# Patient Record
Sex: Female | Born: 1975
Health system: Southern US, Community
[De-identification: ages and names within clinical notes are randomized; demographics above are authoritative.]

## PROBLEM LIST (undated history)

## (undated) DIAGNOSIS — N939 Abnormal uterine and vaginal bleeding, unspecified: Secondary | ICD-10-CM

## (undated) DIAGNOSIS — D500 Iron deficiency anemia secondary to blood loss (chronic): Secondary | ICD-10-CM

## (undated) DIAGNOSIS — N83202 Unspecified ovarian cyst, left side: Secondary | ICD-10-CM

## (undated) DIAGNOSIS — Z973 Presence of spectacles and contact lenses: Secondary | ICD-10-CM

## (undated) DIAGNOSIS — D259 Leiomyoma of uterus, unspecified: Secondary | ICD-10-CM

## (undated) DIAGNOSIS — Z87442 Personal history of urinary calculi: Secondary | ICD-10-CM

## (undated) DIAGNOSIS — R011 Cardiac murmur, unspecified: Secondary | ICD-10-CM

---
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
## 2004-08-31 ENCOUNTER — Other Ambulatory Visit: Admission: RE | Admit: 2004-08-31 | Discharge: 2004-08-31 | Payer: Self-pay | Admitting: Family Medicine

## 2005-10-25 ENCOUNTER — Other Ambulatory Visit: Admission: RE | Admit: 2005-10-25 | Discharge: 2005-10-25 | Payer: Self-pay | Admitting: Family Medicine

## 2006-10-29 ENCOUNTER — Other Ambulatory Visit: Admission: RE | Admit: 2006-10-29 | Discharge: 2006-10-29 | Payer: Self-pay | Admitting: Family Medicine

## 2007-05-06 ENCOUNTER — Other Ambulatory Visit: Admission: RE | Admit: 2007-05-06 | Discharge: 2007-05-06 | Payer: Self-pay | Admitting: Family Medicine

## 2007-05-23 ENCOUNTER — Ambulatory Visit: Payer: Self-pay | Admitting: Oncology

## 2007-07-25 ENCOUNTER — Ambulatory Visit: Payer: Self-pay | Admitting: Oncology

## 2008-04-28 ENCOUNTER — Other Ambulatory Visit: Admission: RE | Admit: 2008-04-28 | Discharge: 2008-04-28 | Payer: Self-pay | Admitting: Family Medicine

## 2009-07-14 ENCOUNTER — Other Ambulatory Visit: Admission: RE | Admit: 2009-07-14 | Discharge: 2009-07-14 | Payer: Self-pay | Admitting: Family Medicine

## 2009-07-25 ENCOUNTER — Encounter: Admission: RE | Admit: 2009-07-25 | Discharge: 2009-07-25 | Payer: Self-pay | Admitting: Family Medicine

## 2010-06-06 IMAGING — MG MM SCREEN MAMMOGRAM BILATERAL
4 series · 4 of 4 positions shown · non-contrast
Comparison: none

DG SCREEN MAMMOGRAM BILATERAL
Bilateral CC and MLO view(s) were taken.

DIGITAL SCREENING MAMMOGRAM WITH CAD:
The breast tissue is heterogeneously dense.  No masses or malignant type calcifications are 
identified.
Images were processed with CAD.

[R CC]
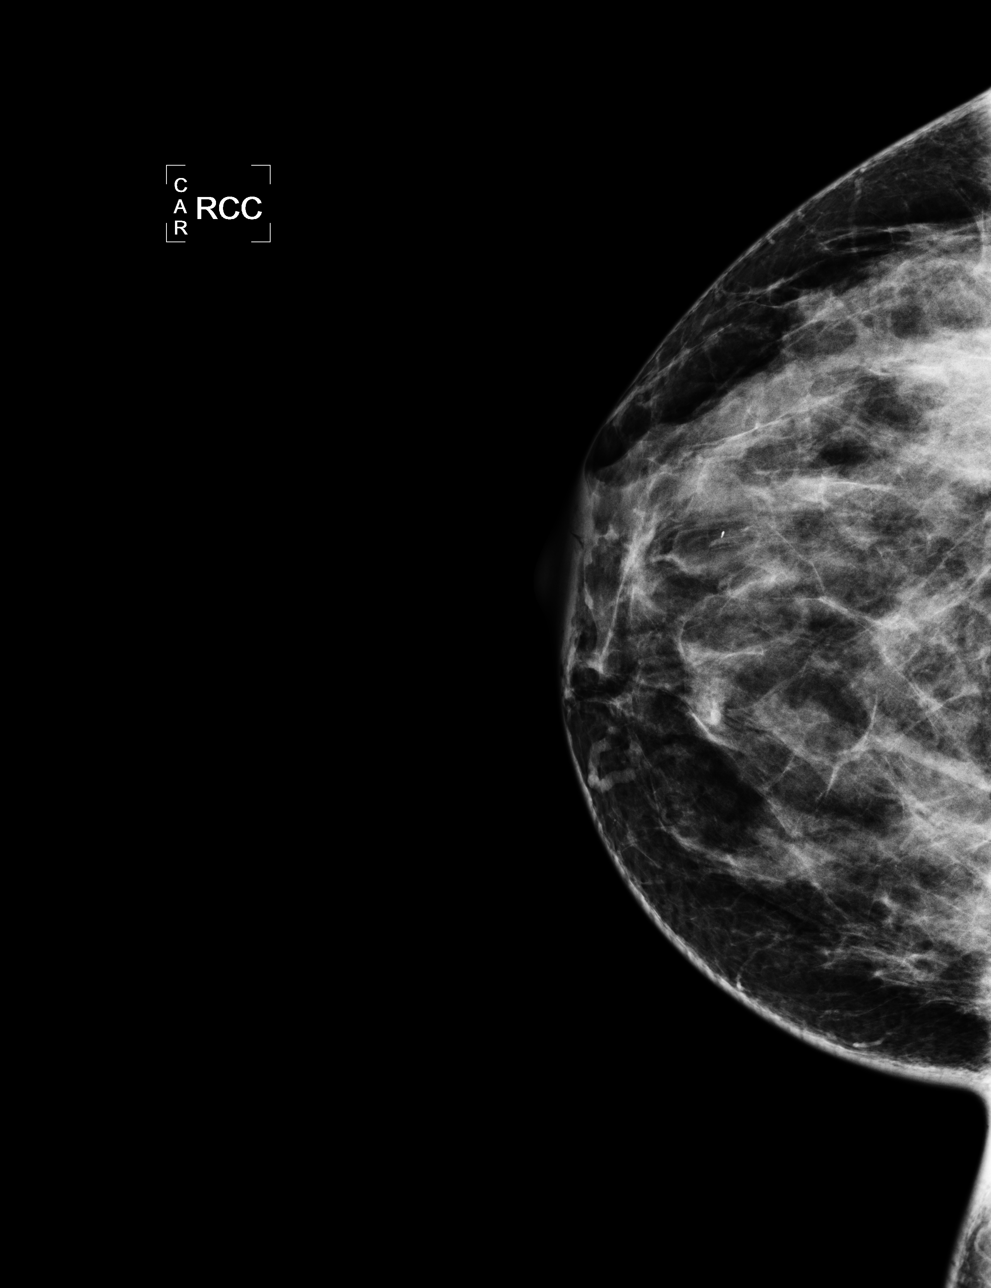

[L CC]
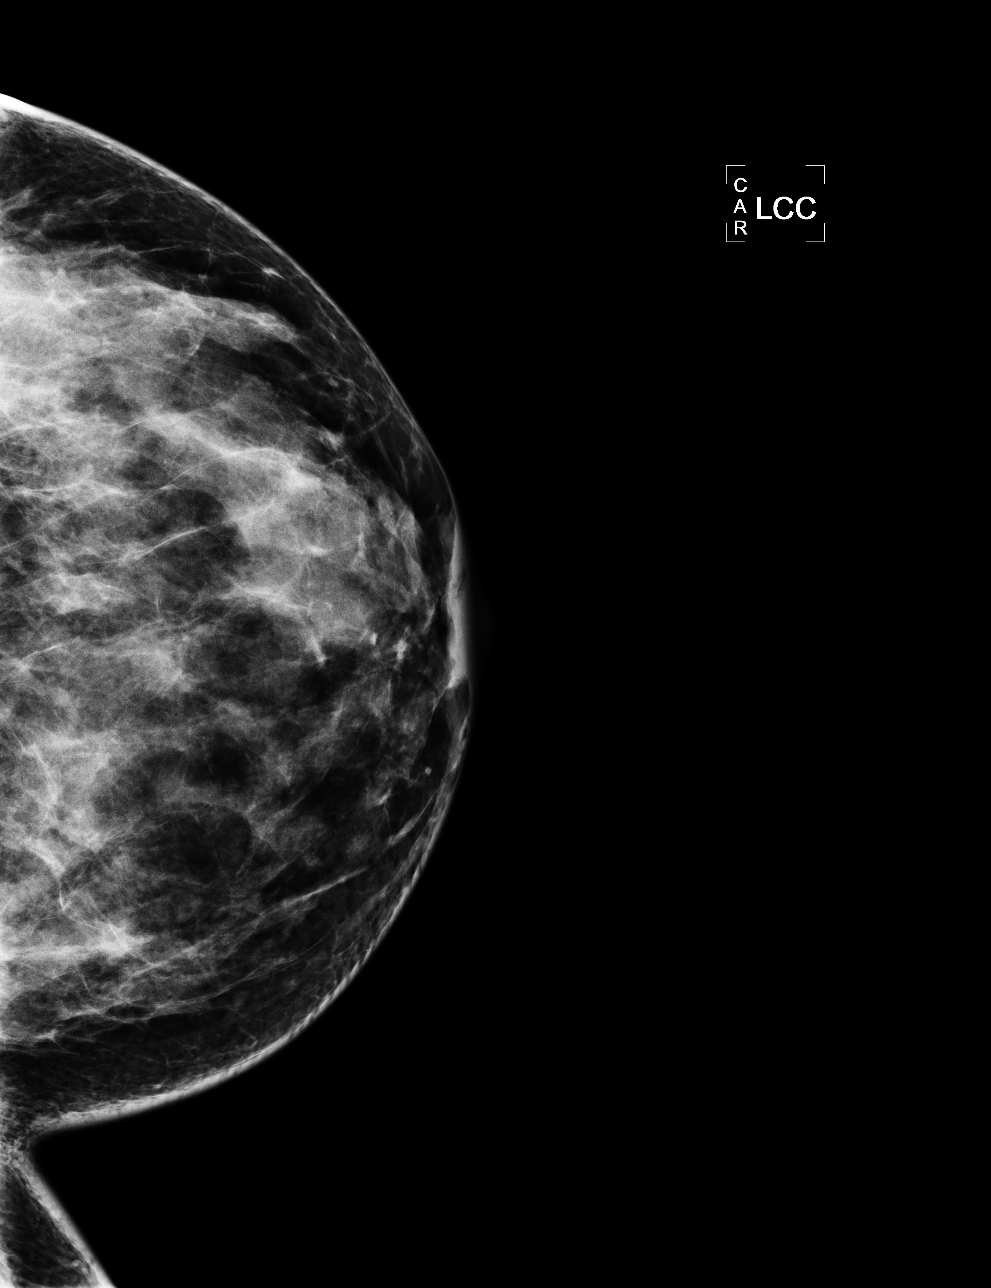

[L MLO]
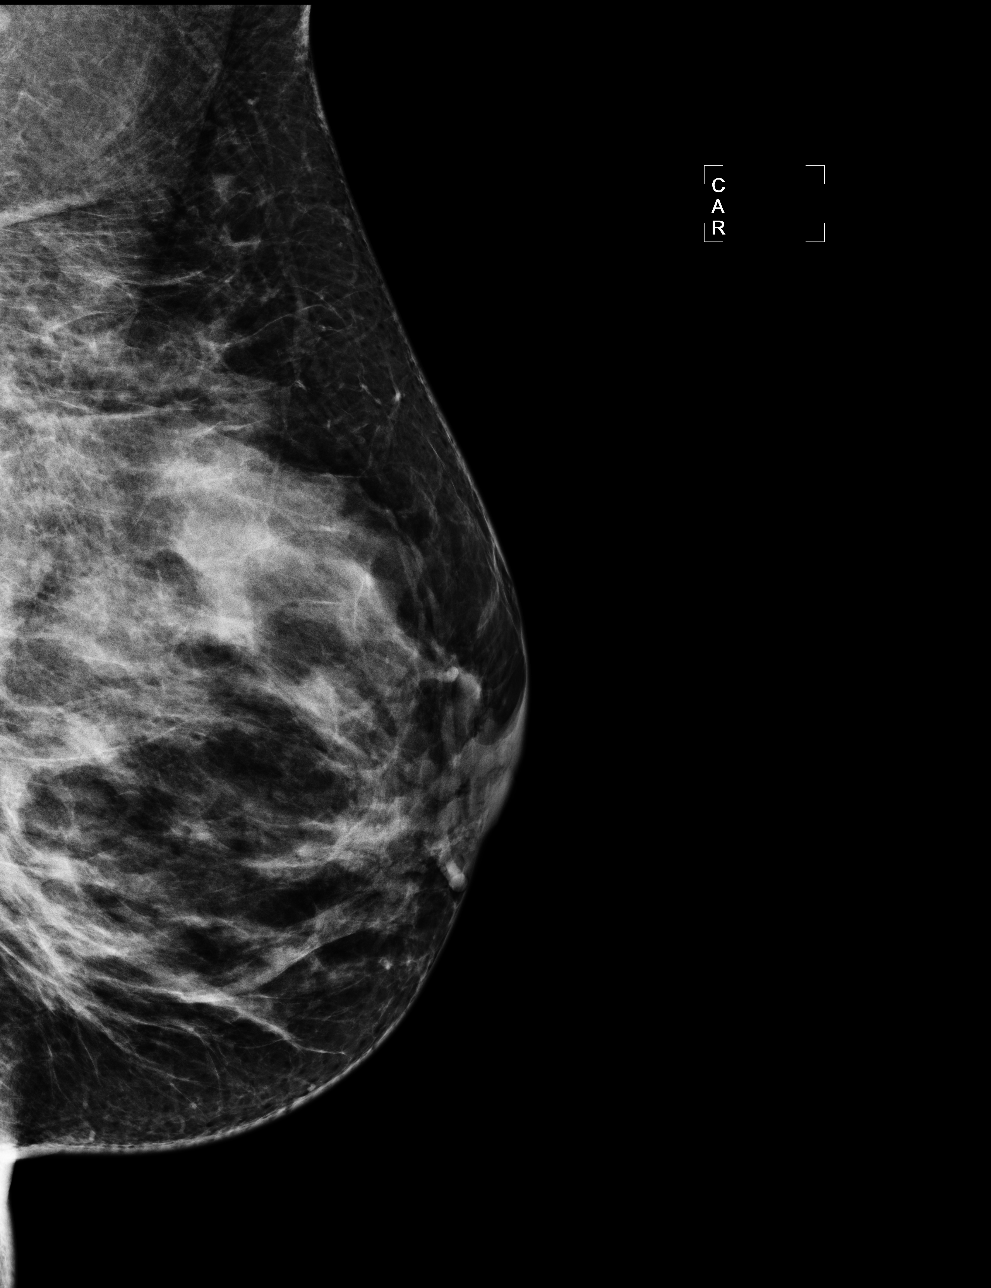

[R MLO]
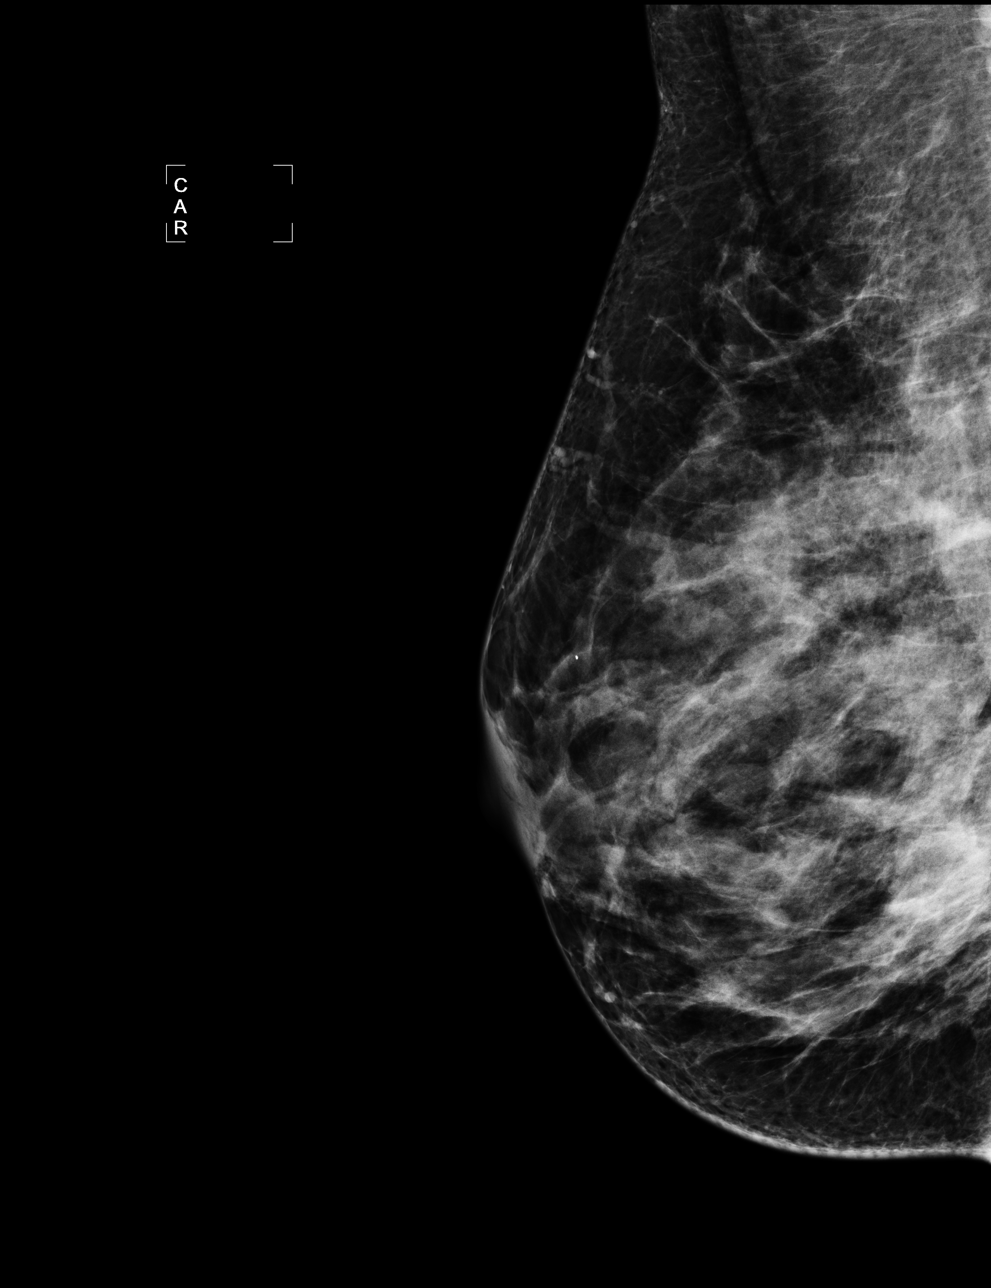

[4 of 4 positions shown; findings below may reference images not displayed]

IMPRESSION: No specific mammographic evidence of malignancy.  Next screening mammogram is recommended in one 
year.

A result letter of this screening mammogram will be mailed directly to the patient.

ASSESSMENT: Negative - BI-RADS 1

Screening mammogram in 1 year.
,

## 2010-07-14 ENCOUNTER — Other Ambulatory Visit: Admission: RE | Admit: 2010-07-14 | Discharge: 2010-07-14 | Payer: Self-pay | Admitting: Family Medicine

## 2010-08-01 ENCOUNTER — Encounter: Admission: RE | Admit: 2010-08-01 | Discharge: 2010-08-01 | Payer: Self-pay | Admitting: Family Medicine

## 2010-08-01 IMAGING — MG MM DIGITAL SCREENING
4 series · 4 of 4 positions shown · non-contrast
Comparison: Prior studies.

DG SCREEN MAMMOGRAM BILATERAL
Bilateral CC and MLO view(s) were taken.

DIGITAL SCREENING MAMMOGRAM WITH CAD:

[R CC]
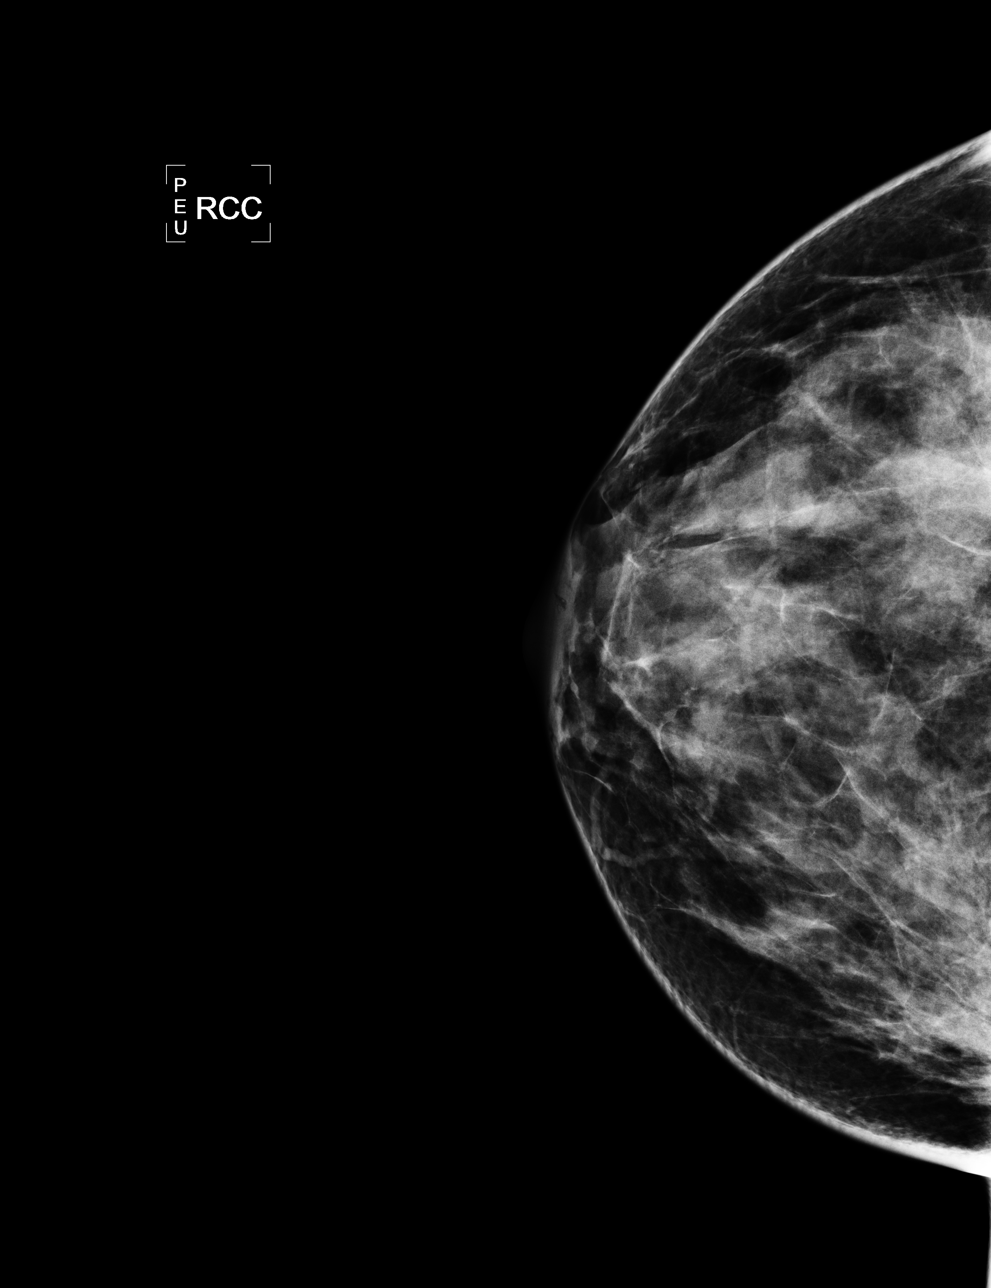

[L CC]
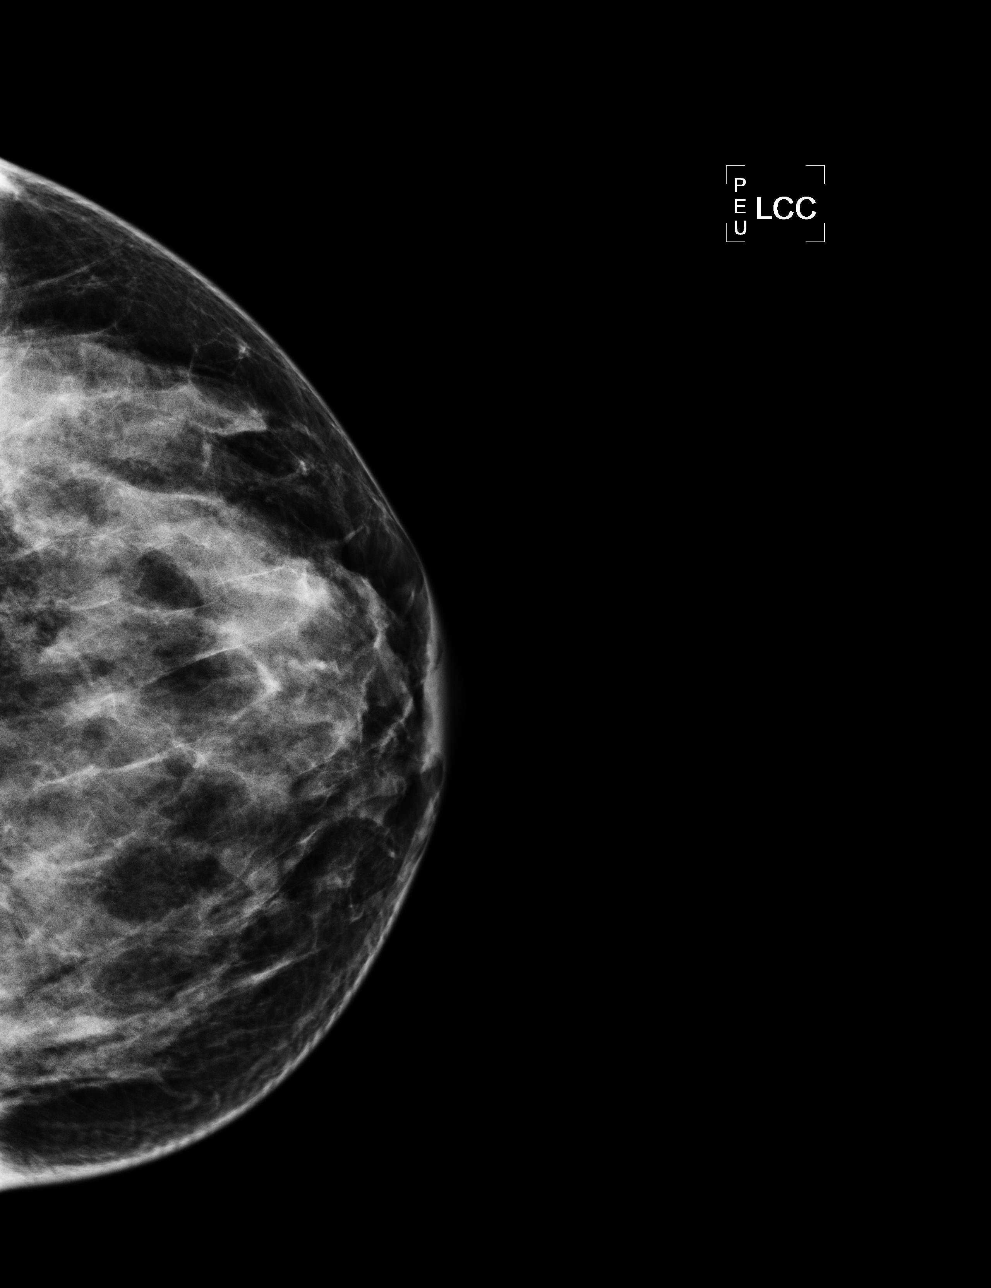

[L MLO]
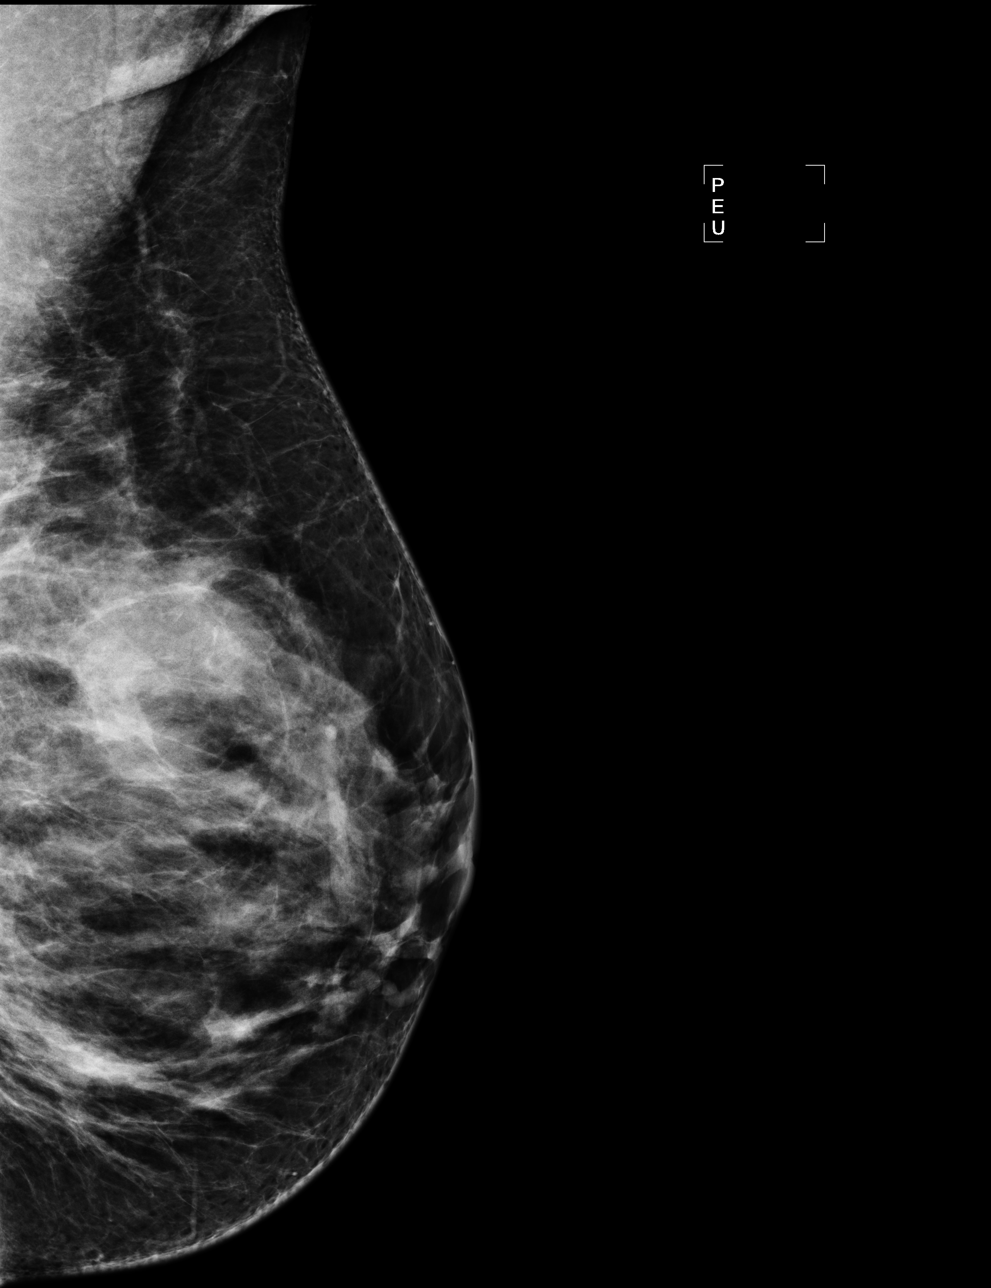

[R MLO]
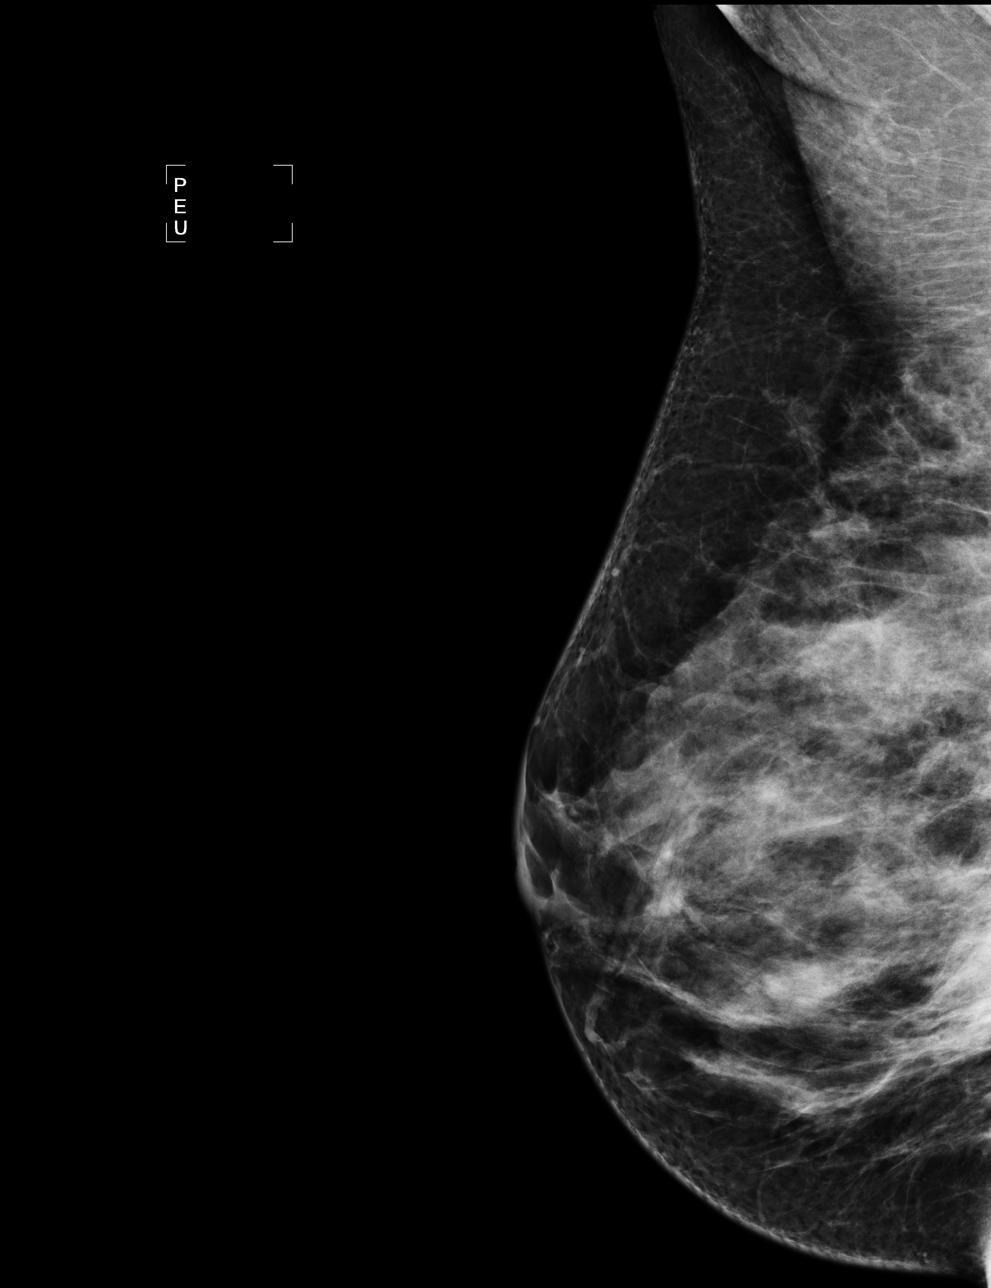

[4 of 4 positions shown; findings below may reference images not displayed]

The breast tissue is heterogeneously dense.  There is no dominant mass, architectural distortion or
calcification to suggest malignancy.

Images were processed with CAD.
IMPRESSION: No mammographic evidence of malignancy.  Suggest yearly screening mammography begin at age 40.

A result letter of this screening mammogram will be mailed directly to the patient.

ASSESSMENT: Negative - BI-RADS 1

Screening mammogram at age 38.
,

## 2012-11-04 ENCOUNTER — Other Ambulatory Visit: Payer: Self-pay | Admitting: Family Medicine

## 2012-11-04 ENCOUNTER — Other Ambulatory Visit (HOSPITAL_COMMUNITY)
Admission: RE | Admit: 2012-11-04 | Discharge: 2012-11-04 | Disposition: A | Discharge status: Home / Self Care | Payer: 59 | Source: Ambulatory Visit | Attending: Family Medicine | Admitting: Family Medicine

## 2012-11-04 DIAGNOSIS — Z01419 Encounter for gynecological examination (general) (routine) without abnormal findings: Secondary | ICD-10-CM | POA: Insufficient documentation

## 2014-11-26 ENCOUNTER — Other Ambulatory Visit: Payer: Self-pay

## 2014-11-26 DIAGNOSIS — Z1231 Encounter for screening mammogram for malignant neoplasm of breast: Secondary | ICD-10-CM

## 2014-12-14 ENCOUNTER — Ambulatory Visit
Admission: RE | Admit: 2014-12-14 | Discharge: 2014-12-14 | Disposition: A | Discharge status: Home / Self Care | Payer: 59 | Source: Ambulatory Visit

## 2014-12-14 DIAGNOSIS — Z1231 Encounter for screening mammogram for malignant neoplasm of breast: Secondary | ICD-10-CM

## 2014-12-14 IMAGING — MG MM SCREEN MAMMOGRAM BILATERAL
4 series · 4 of 4 positions shown · non-contrast
Comparison: Previous exam(s).

CLINICAL DATA: Screening.

EXAM:
DIGITAL SCREENING BILATERAL MAMMOGRAM WITH CAD

[R CC]
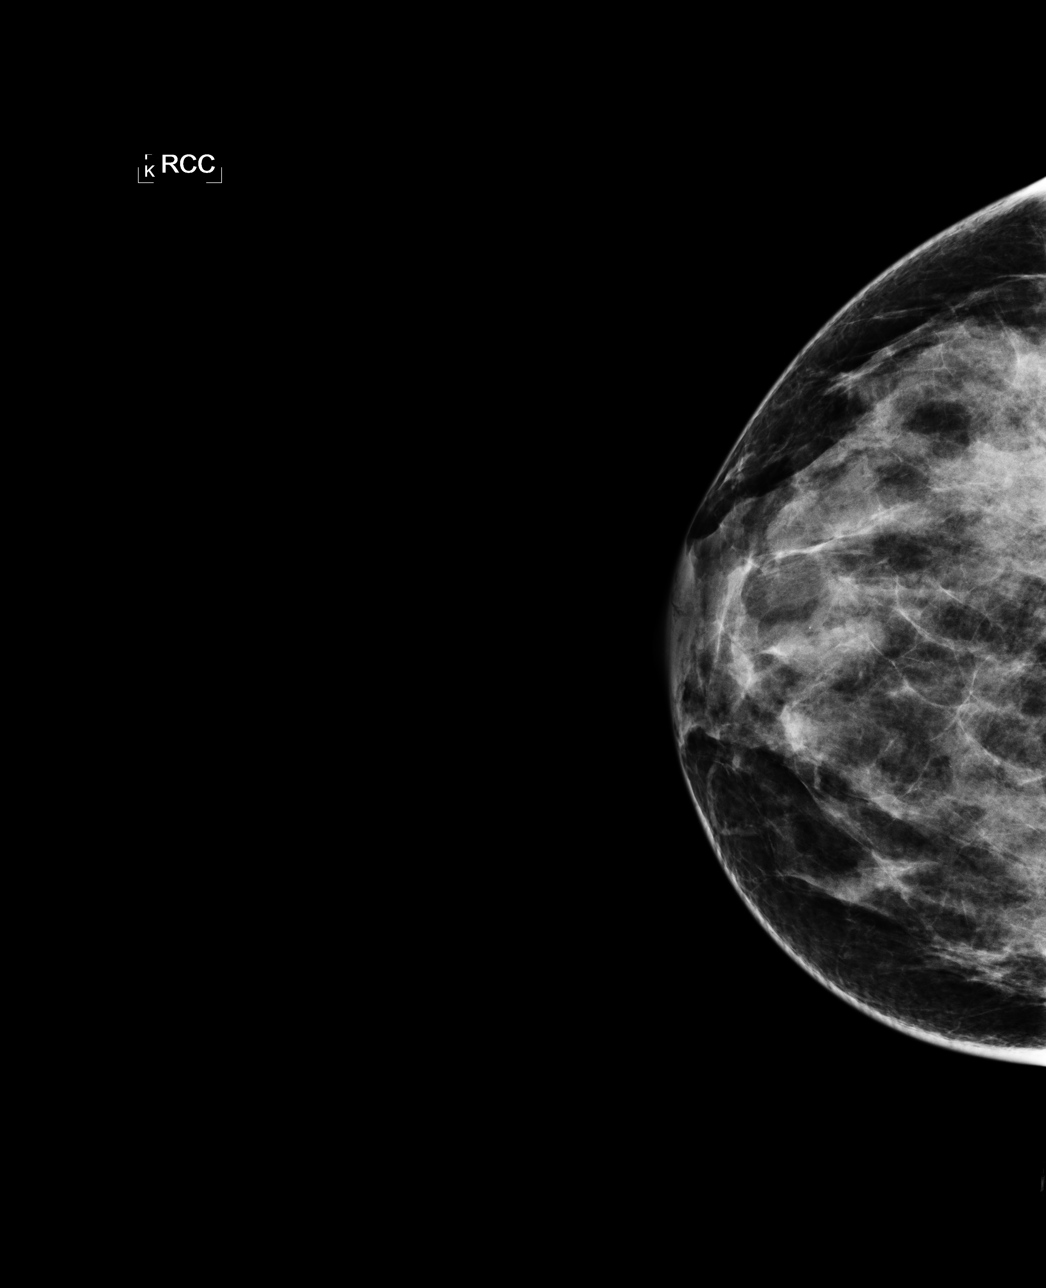

[L CC]
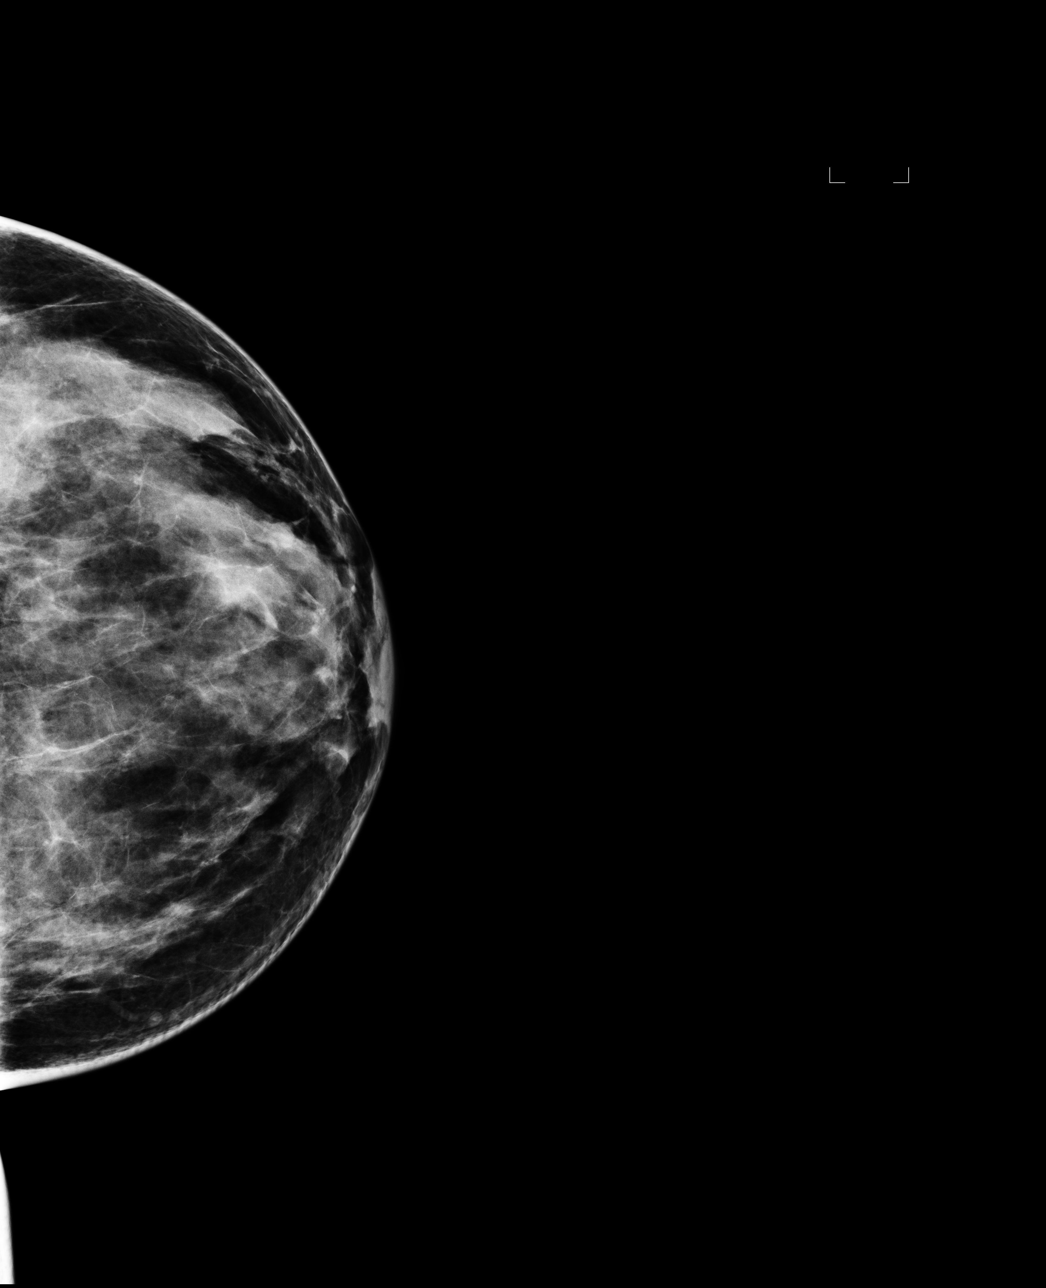

[L MLO]
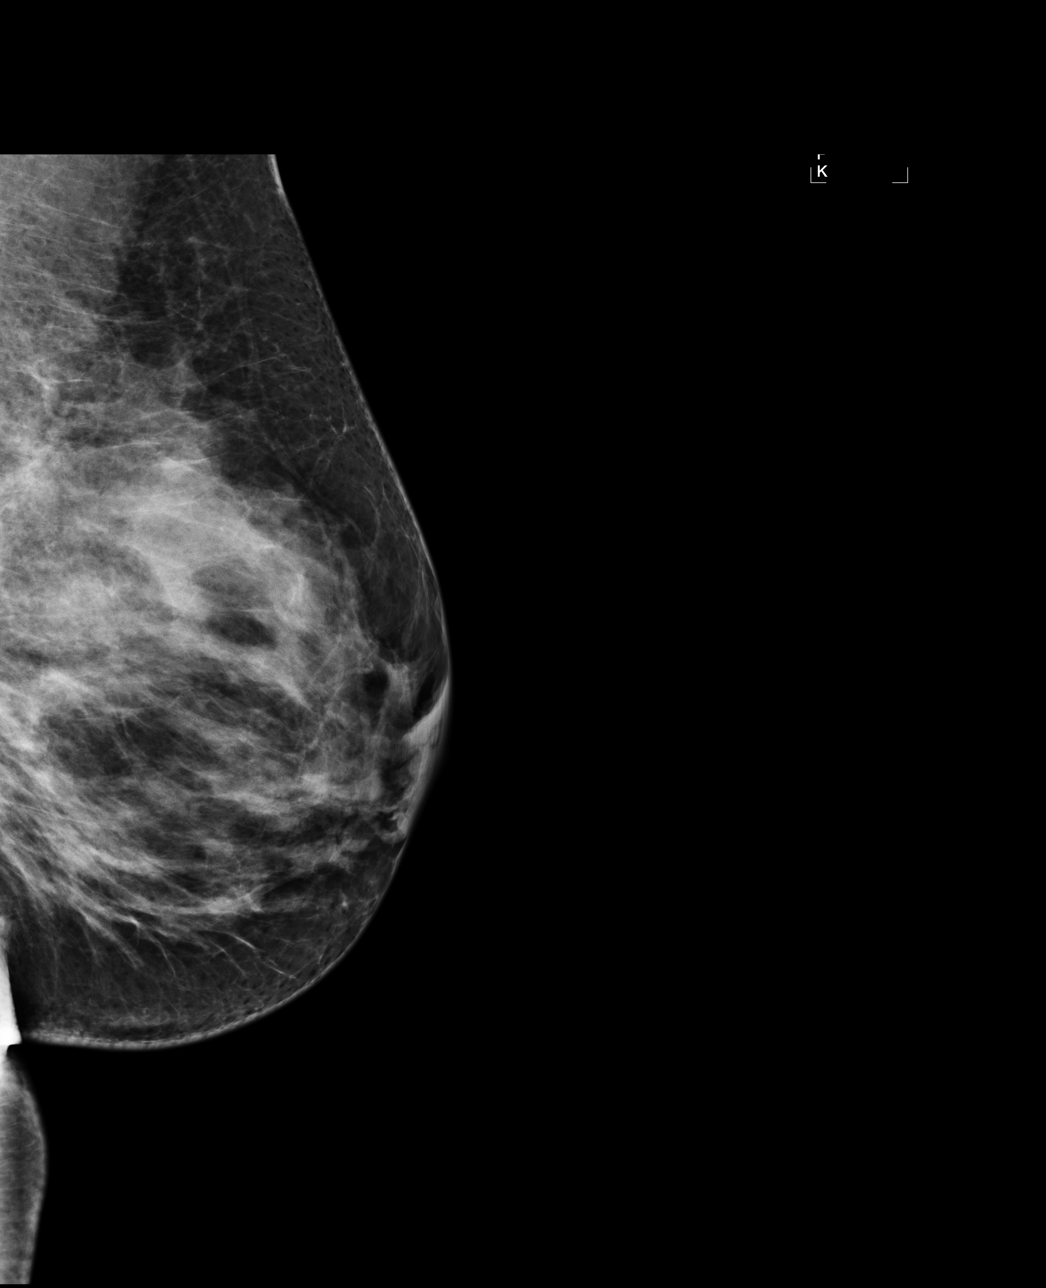

[R MLO]
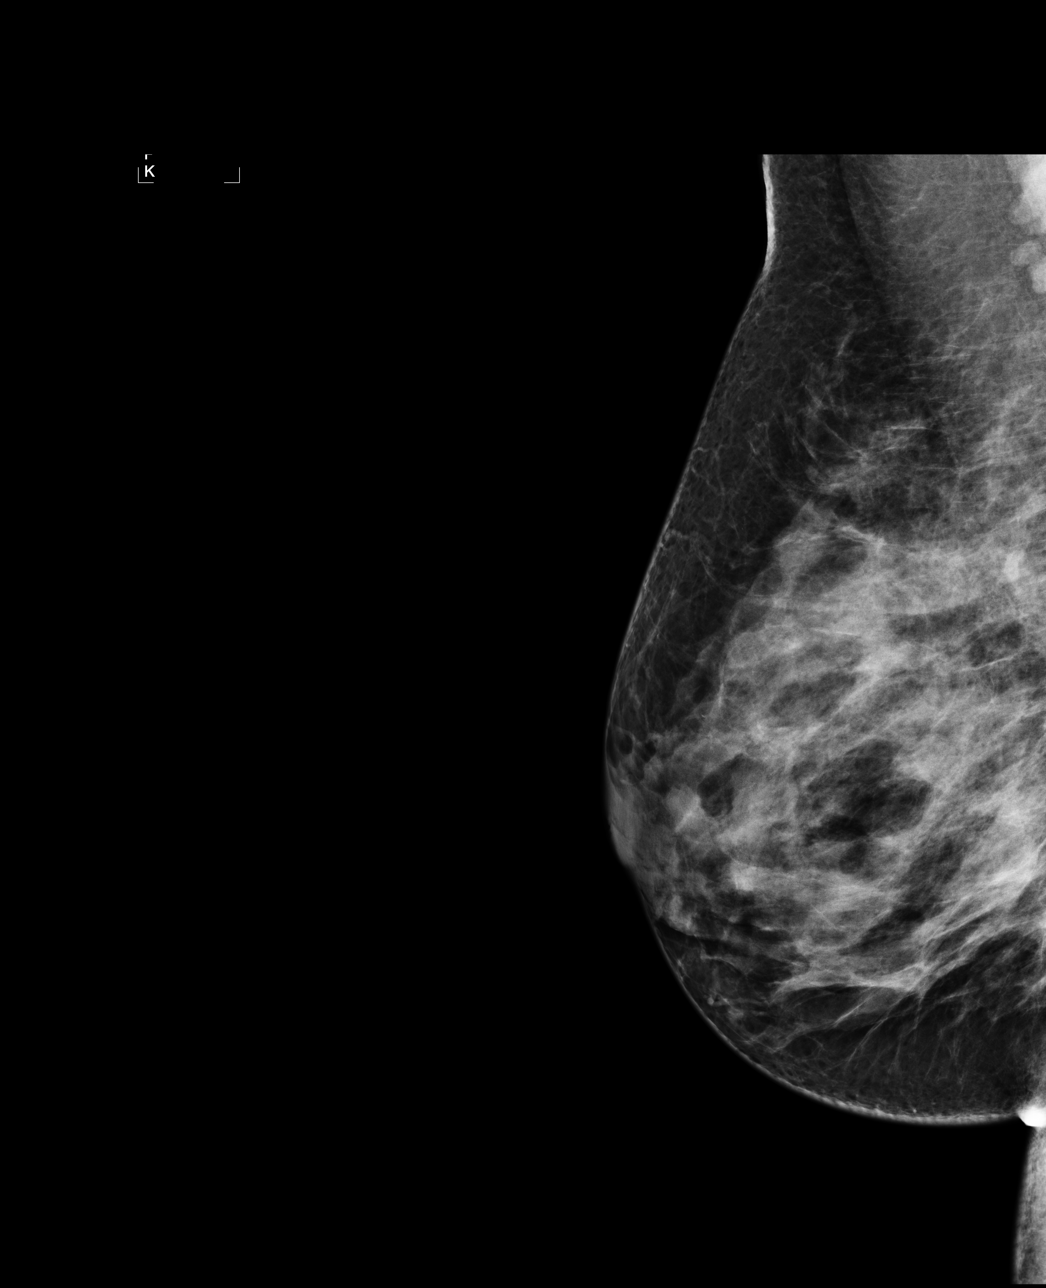

[4 of 4 positions shown; findings below may reference images not displayed]

ACR Breast Density Category c: The breast tissue is heterogeneously
dense, which may obscure small masses.
FINDINGS: There are no findings suspicious for malignancy. Images were
processed with CAD.
IMPRESSION: No mammographic evidence of malignancy. A result letter of this
screening mammogram will be mailed directly to the patient.

RECOMMENDATION:
Screening mammogram in one year. (Code:YJ-2-FEZ)

BI-RADS CATEGORY  1: Negative.

## 2015-11-15 ENCOUNTER — Other Ambulatory Visit: Payer: Self-pay

## 2015-11-15 DIAGNOSIS — Z1231 Encounter for screening mammogram for malignant neoplasm of breast: Secondary | ICD-10-CM

## 2015-12-14 ENCOUNTER — Other Ambulatory Visit: Payer: Self-pay | Admitting: Family Medicine

## 2015-12-14 ENCOUNTER — Other Ambulatory Visit (HOSPITAL_COMMUNITY)
Admission: RE | Admit: 2015-12-14 | Discharge: 2015-12-14 | Disposition: A | Discharge status: Home / Self Care | Payer: 59 | Source: Ambulatory Visit | Attending: Family Medicine | Admitting: Family Medicine

## 2015-12-14 DIAGNOSIS — Z124 Encounter for screening for malignant neoplasm of cervix: Secondary | ICD-10-CM | POA: Diagnosis present

## 2015-12-16 ENCOUNTER — Ambulatory Visit
Admission: RE | Admit: 2015-12-16 | Discharge: 2015-12-16 | Disposition: A | Discharge status: Home / Self Care | Payer: 59 | Source: Ambulatory Visit

## 2015-12-16 DIAGNOSIS — Z1231 Encounter for screening mammogram for malignant neoplasm of breast: Secondary | ICD-10-CM

## 2015-12-16 IMAGING — MG MM SCREENING BREAST TOMO BILATERAL
8 series · 9 of 24 positions shown · non-contrast
Comparison: Previous exam(s).

CLINICAL DATA: Screening.

EXAM:
DIGITAL SCREENING BILATERAL MAMMOGRAM WITH 3D TOMO WITH CAD

[R MLO]
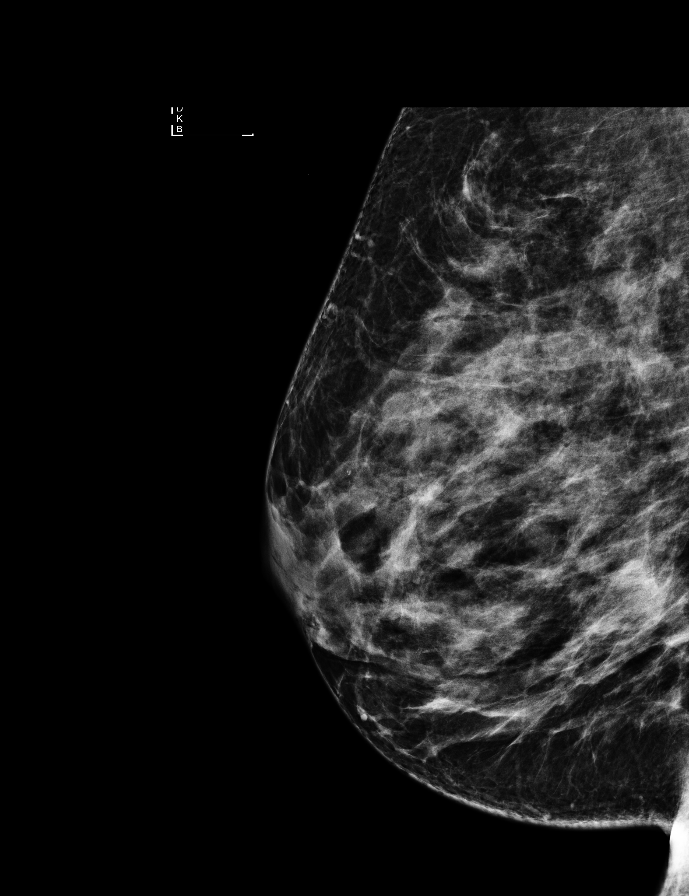

[R CC]
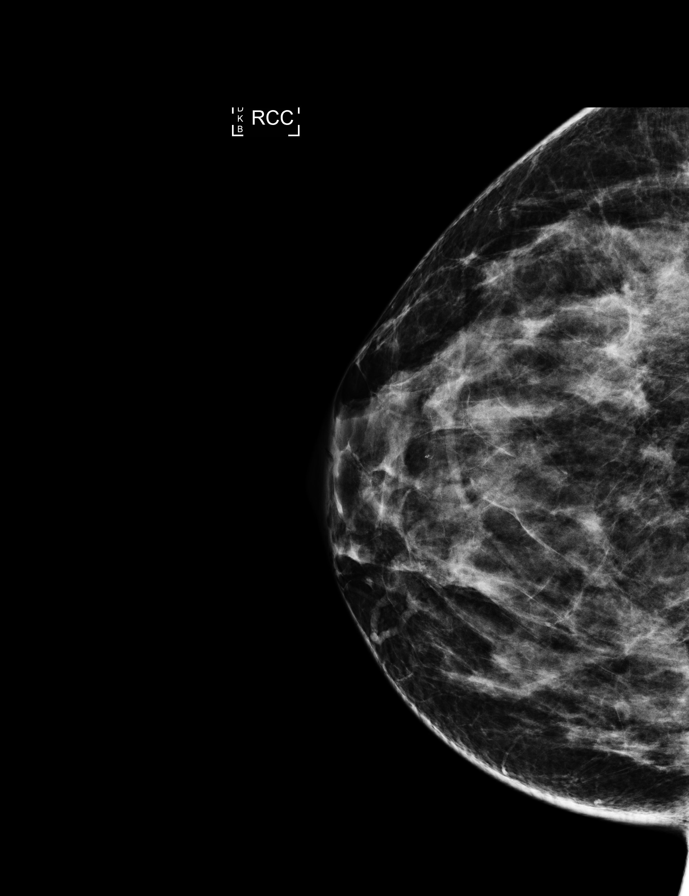

[L CC]
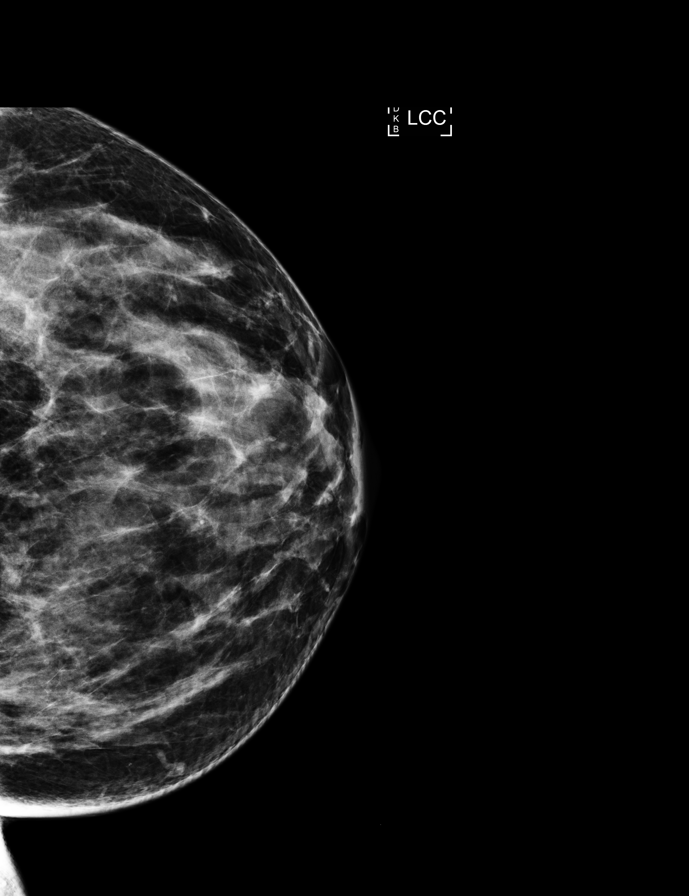

[L MLO]
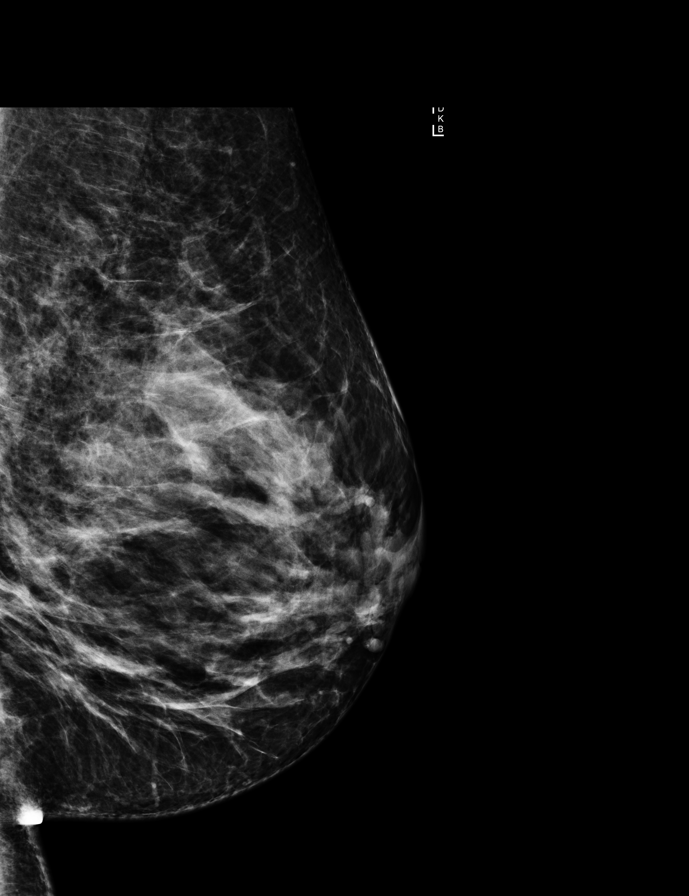

[R CC tomo · 2 of 75 frames shown]
[frame 25/75]
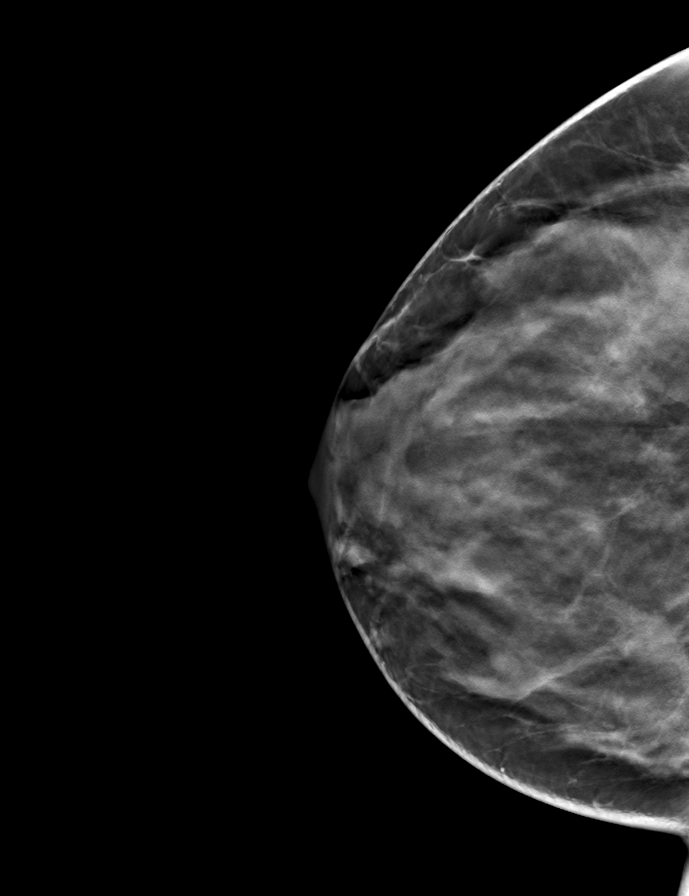
[frame 38/75]
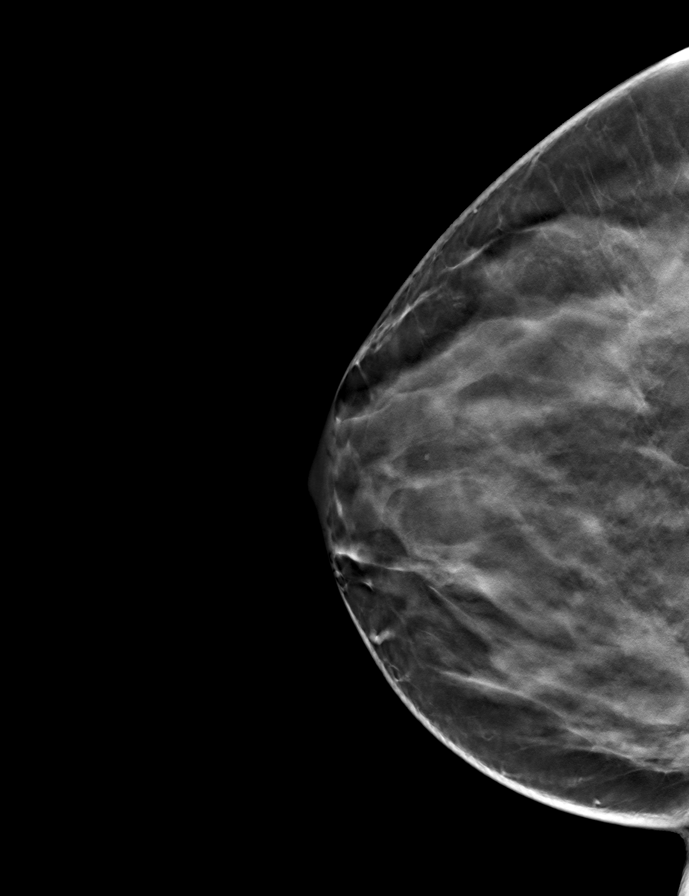

[L CC tomo · tomo slice 38/75.0]
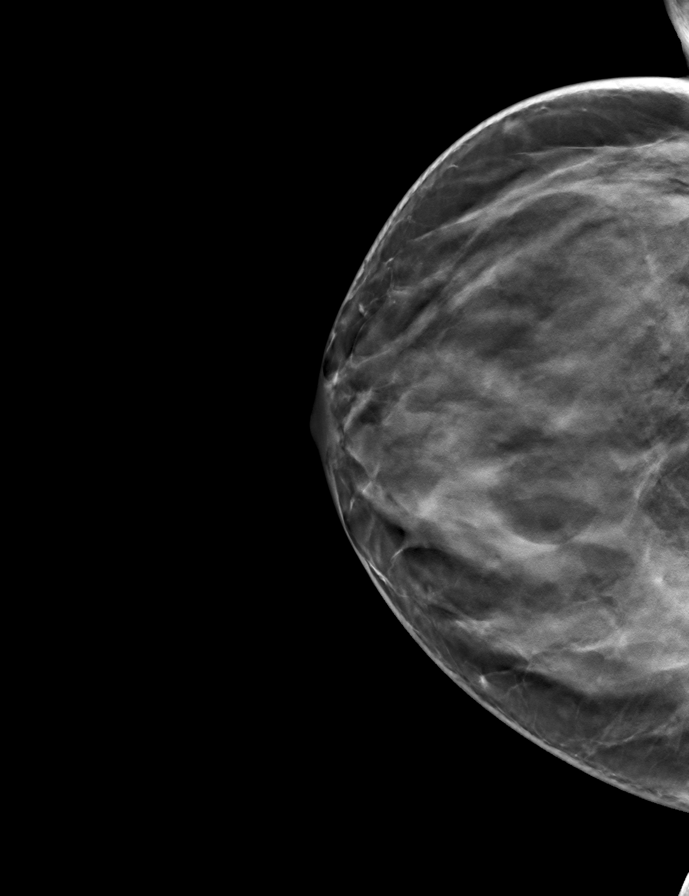

[L MLO tomo · tomo slice 43/84.0]
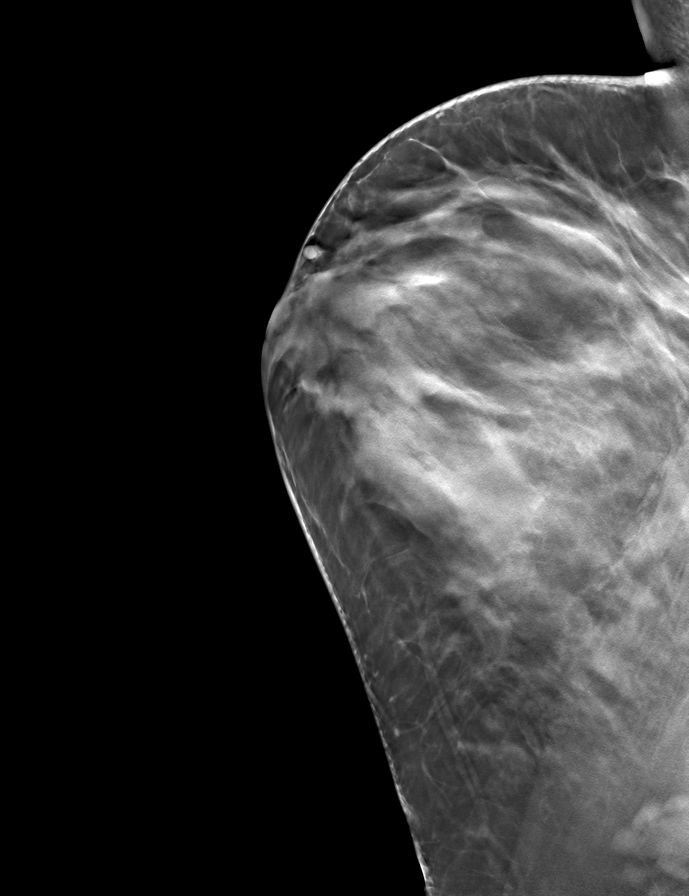

[R MLO tomo · tomo slice 38/75.0]
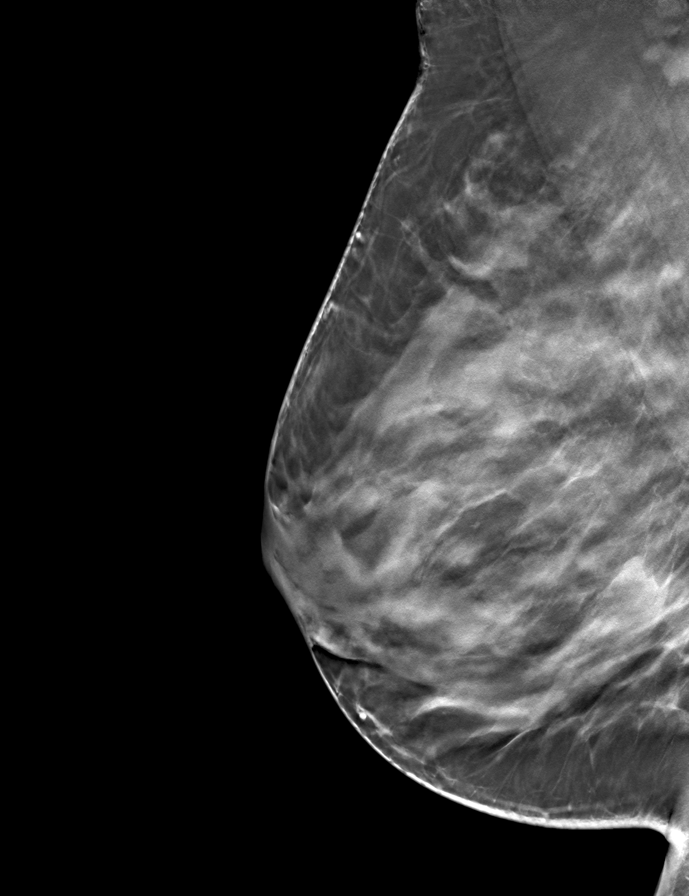

[9 of 24 positions shown; findings below may reference images not displayed]

ACR Breast Density Category c: The breast tissue is heterogeneously
dense, which may obscure small masses.
FINDINGS: There are no findings suspicious for malignancy. Images were
processed with CAD.
IMPRESSION: No mammographic evidence of malignancy. A result letter of this
screening mammogram will be mailed directly to the patient.

RECOMMENDATION:
Screening mammogram in one year. (Code:OA-G-1SS)

BI-RADS CATEGORY  1: Negative.

## 2015-12-19 LAB — CYTOLOGY - PAP

## 2016-12-18 ENCOUNTER — Ambulatory Visit
Admission: RE | Admit: 2016-12-18 | Discharge: 2016-12-18 | Disposition: A | Discharge status: Home / Self Care | Payer: 59 | Source: Ambulatory Visit | Attending: Family Medicine | Admitting: Family Medicine

## 2016-12-18 ENCOUNTER — Other Ambulatory Visit: Payer: Self-pay | Admitting: Family Medicine

## 2016-12-18 DIAGNOSIS — Z1231 Encounter for screening mammogram for malignant neoplasm of breast: Secondary | ICD-10-CM

## 2016-12-18 IMAGING — MG 2D DIGITAL SCREENING BILATERAL MAMMOGRAM WITH CAD AND ADJUNCT TO
8 of 12 series · 8 of 28 positions shown · non-contrast
Comparison: Previous exam(s).

CLINICAL DATA: Screening.

EXAM:
2D DIGITAL SCREENING BILATERAL MAMMOGRAM WITH CAD AND ADJUNCT TOMO

[L MLO synth-2D]
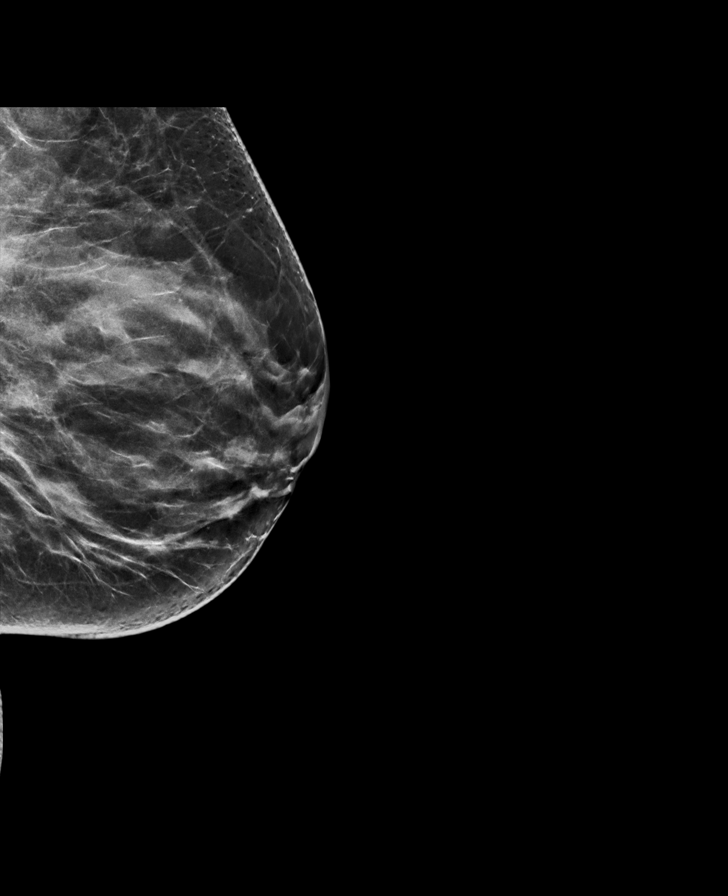

[R CC]
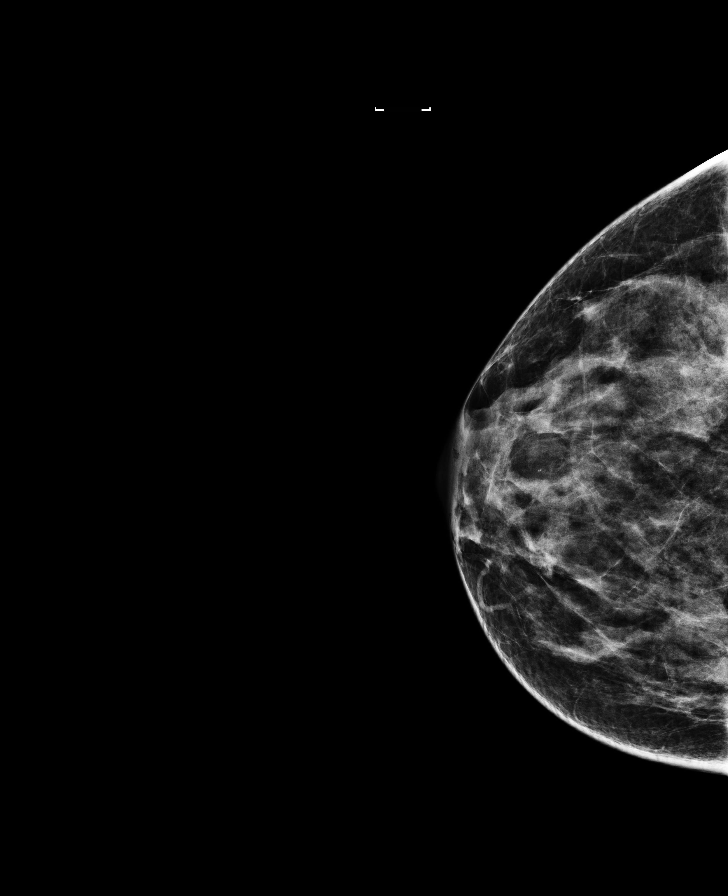

[R CC synth-2D]
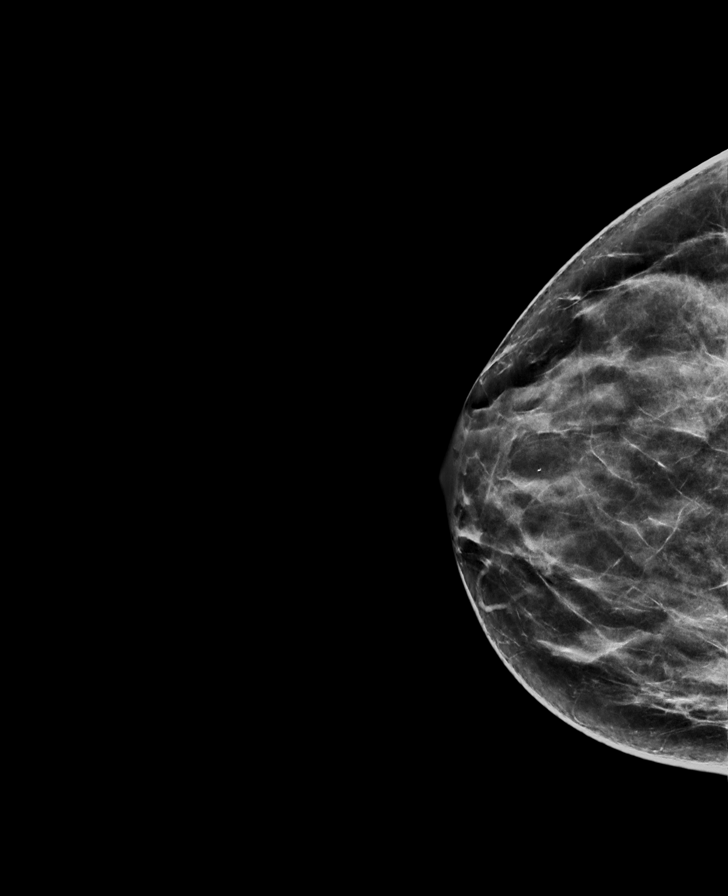

[L CC]
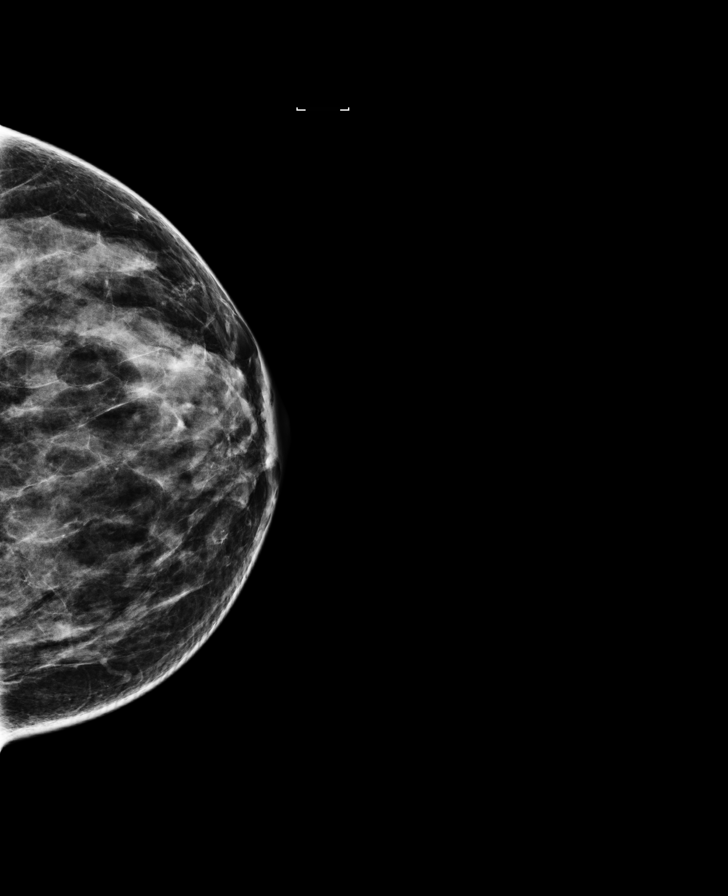

[L CC synth-2D]
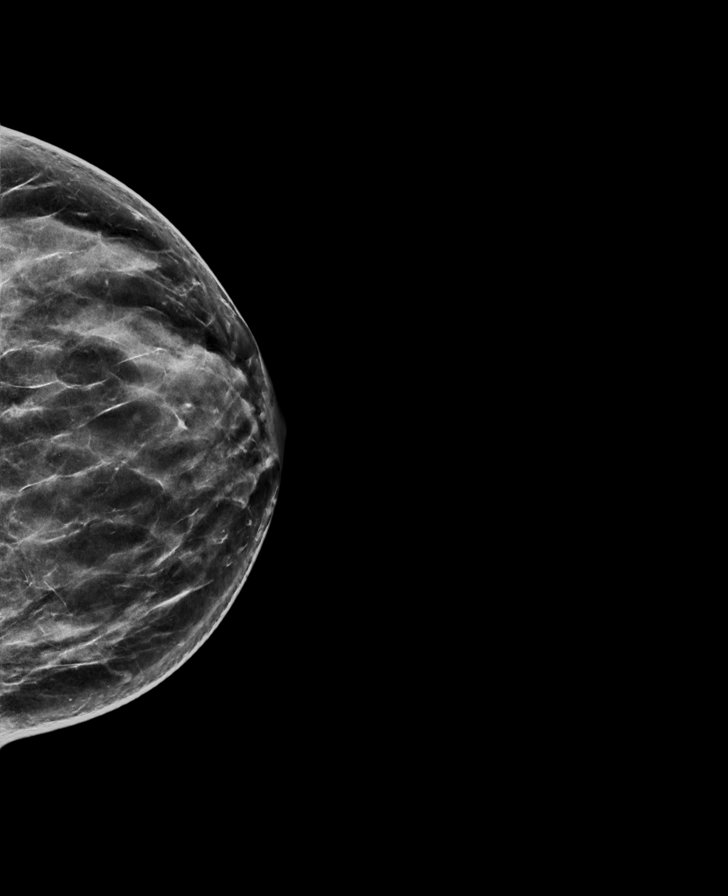

[L MLO]
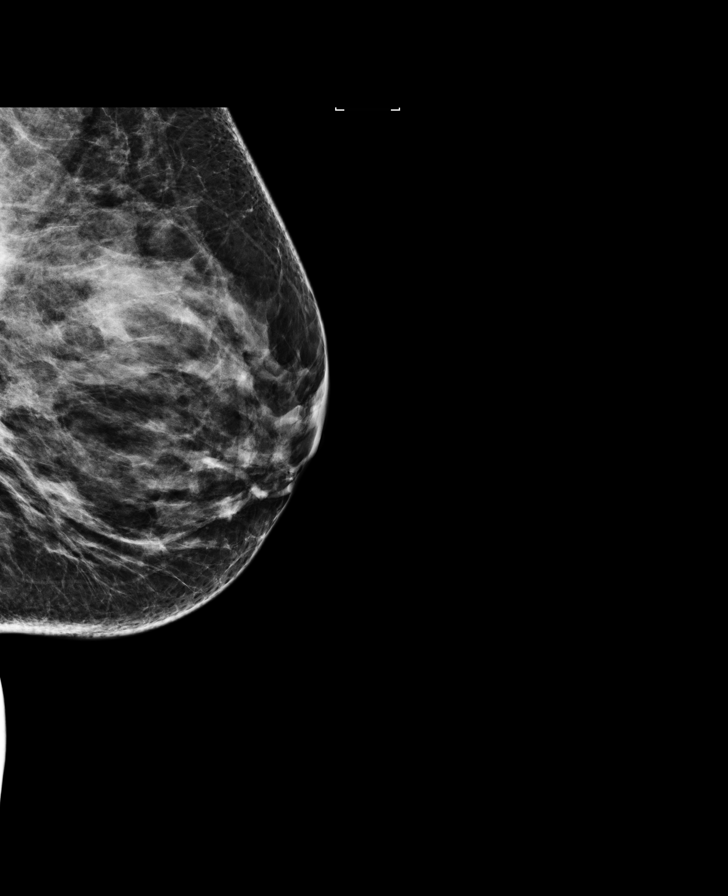

[R MLO]
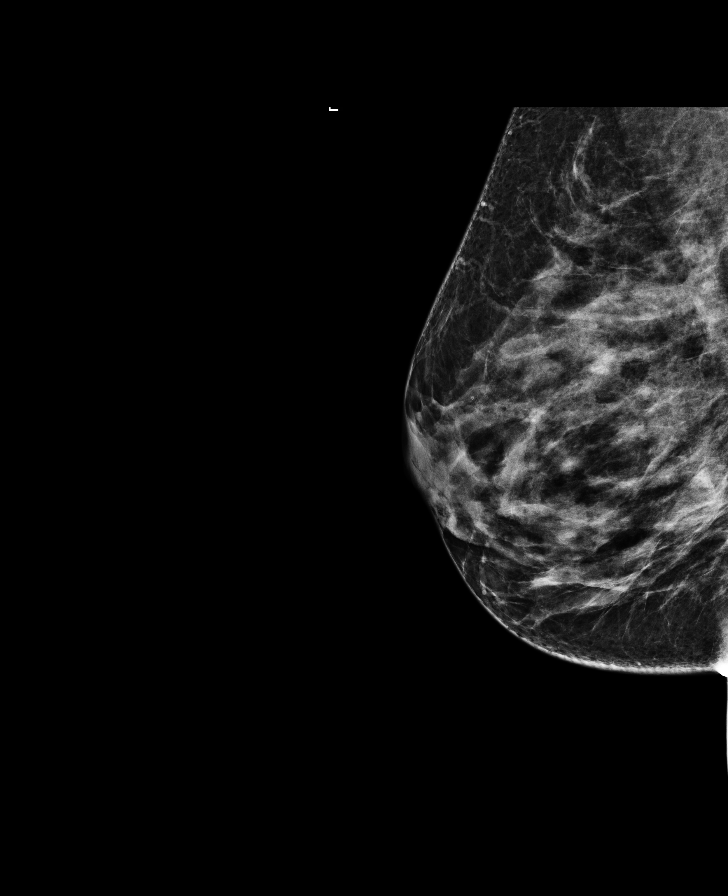

[R MLO synth-2D]
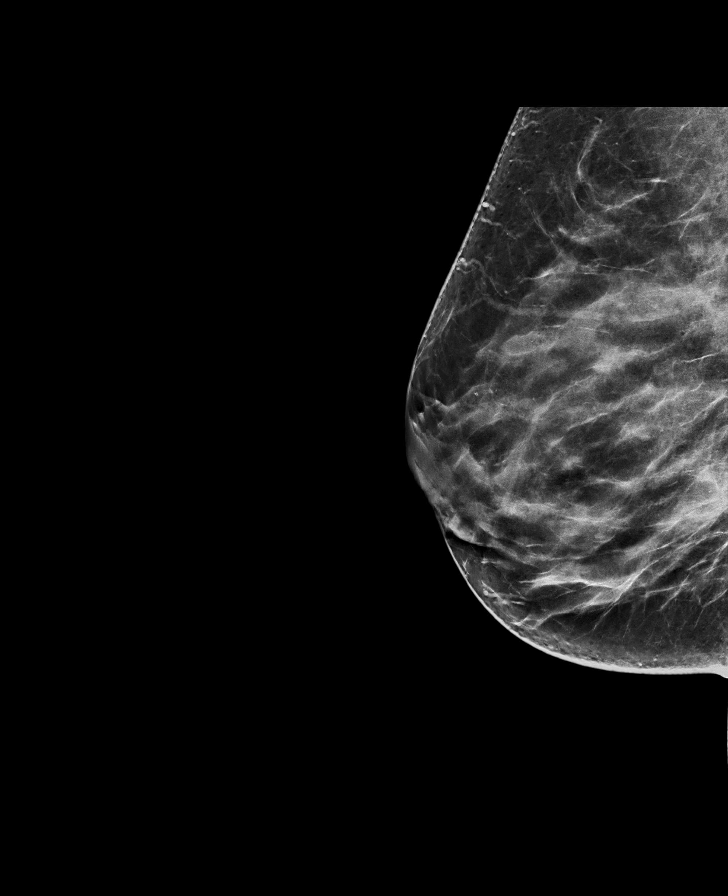

[8 of 28 positions shown; findings below may reference images not displayed]

ACR Breast Density Category c: The breast tissue is heterogeneously
dense, which may obscure small masses.
FINDINGS: There are no findings suspicious for malignancy. Images were
processed with CAD.
IMPRESSION: No mammographic evidence of malignancy. A result letter of this
screening mammogram will be mailed directly to the patient.

RECOMMENDATION:
Screening mammogram in one year. (Code:[id number])

BI-RADS CATEGORY  1: Negative.

## 2017-11-08 ENCOUNTER — Other Ambulatory Visit: Payer: Self-pay | Admitting: Family Medicine

## 2017-11-08 DIAGNOSIS — Z1231 Encounter for screening mammogram for malignant neoplasm of breast: Secondary | ICD-10-CM

## 2017-12-23 ENCOUNTER — Ambulatory Visit
Admission: RE | Admit: 2017-12-23 | Discharge: 2017-12-23 | Disposition: A | Discharge status: Home / Self Care | Payer: 59 | Source: Ambulatory Visit | Attending: Family Medicine | Admitting: Family Medicine

## 2017-12-23 DIAGNOSIS — Z1231 Encounter for screening mammogram for malignant neoplasm of breast: Secondary | ICD-10-CM

## 2017-12-23 IMAGING — MG DIGITAL SCREENING BILATERAL MAMMOGRAM WITH CAD
4 series · 4 of 4 positions shown · non-contrast
Comparison: Previous exam(s).

CLINICAL DATA: Screening.

EXAM:
DIGITAL SCREENING BILATERAL MAMMOGRAM WITH CAD

[L CC]
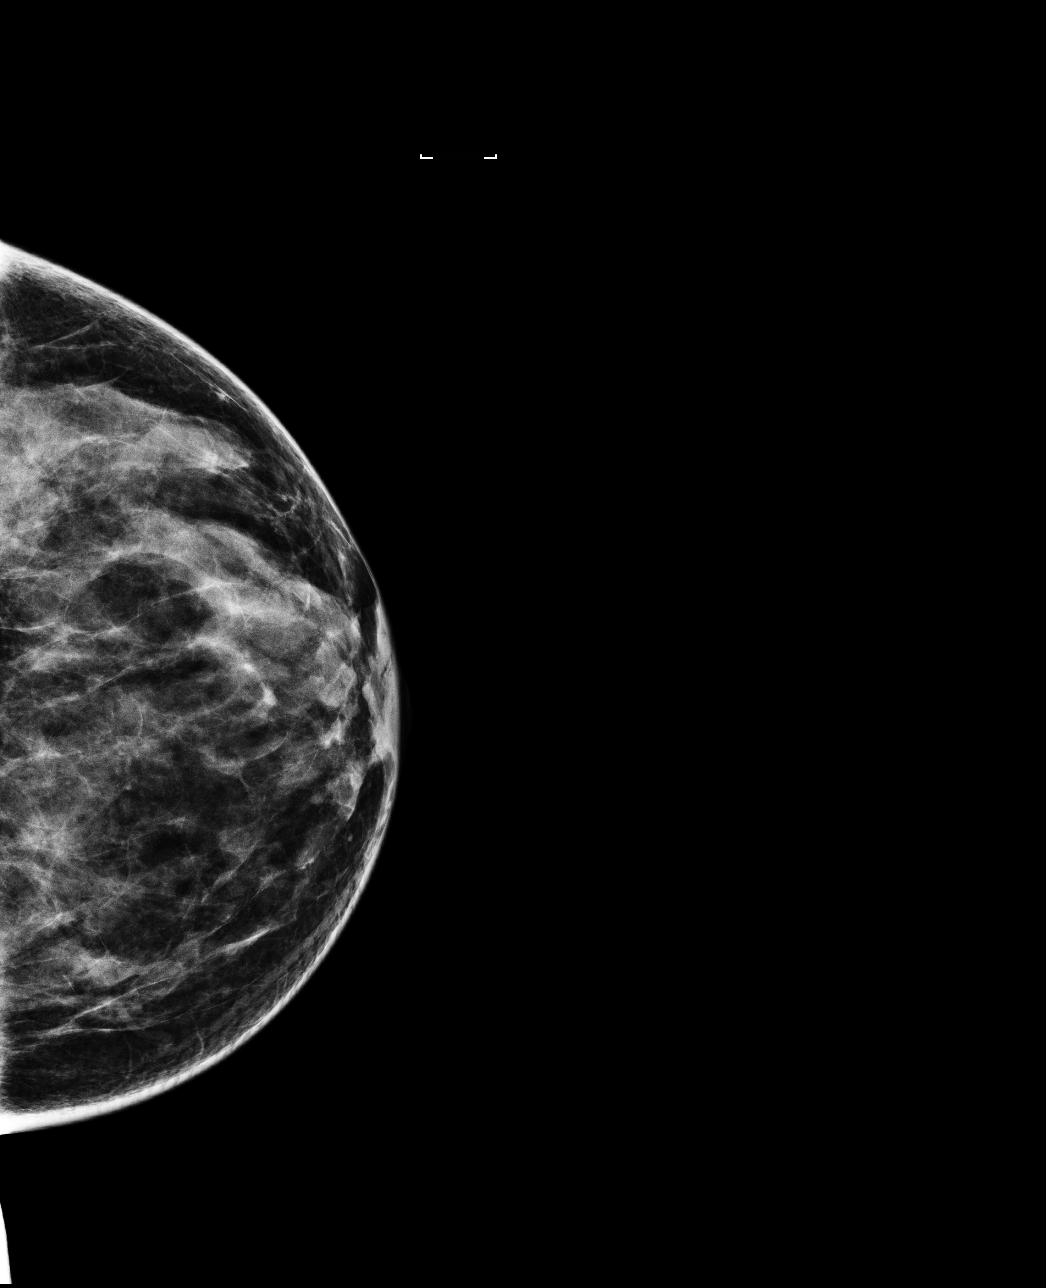

[L MLO]
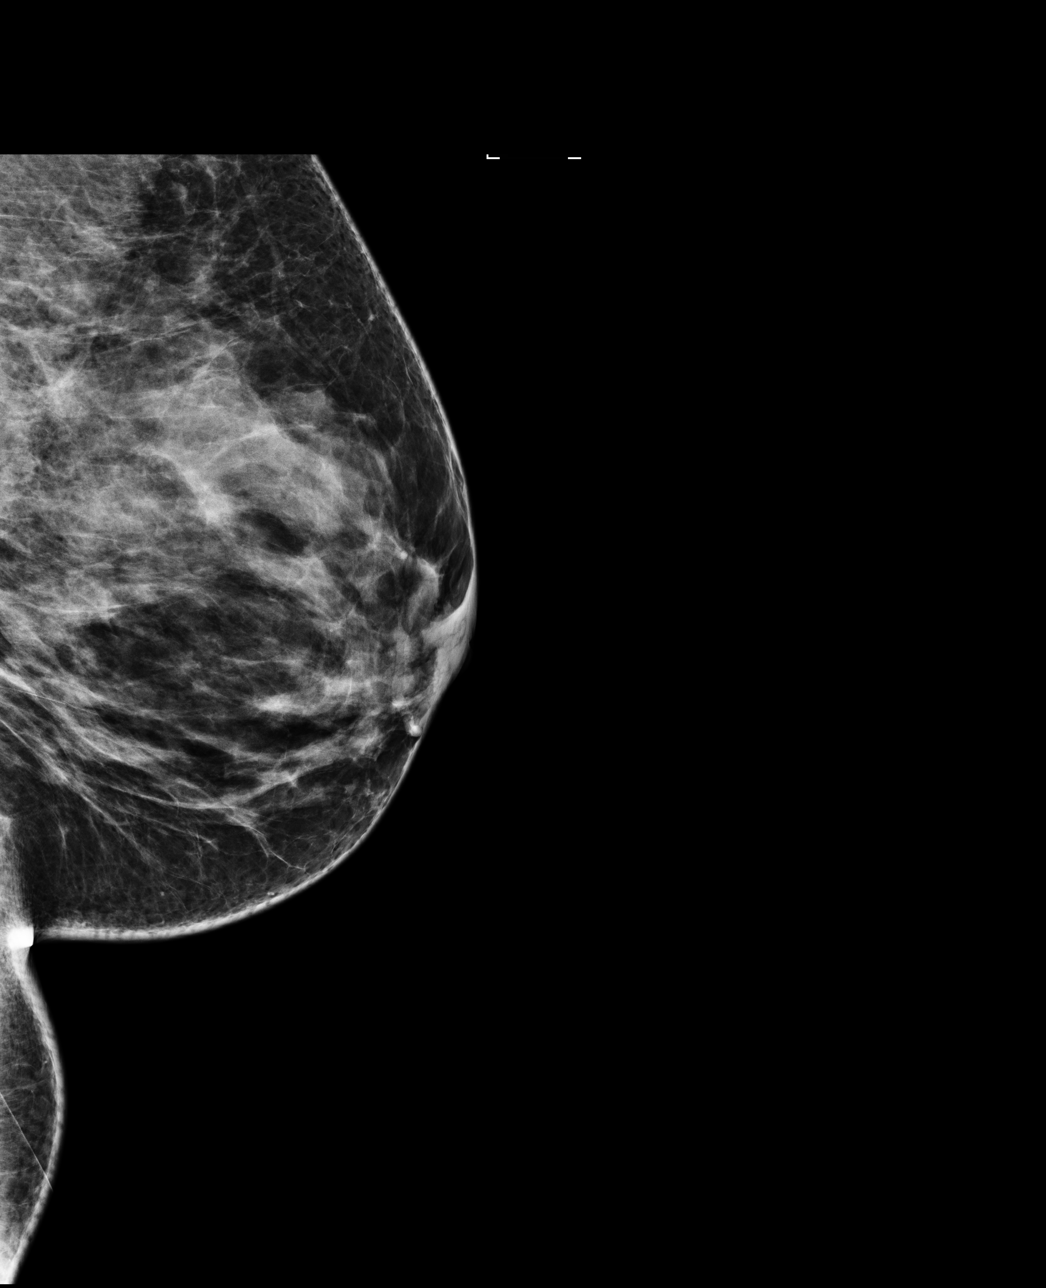

[R MLO]
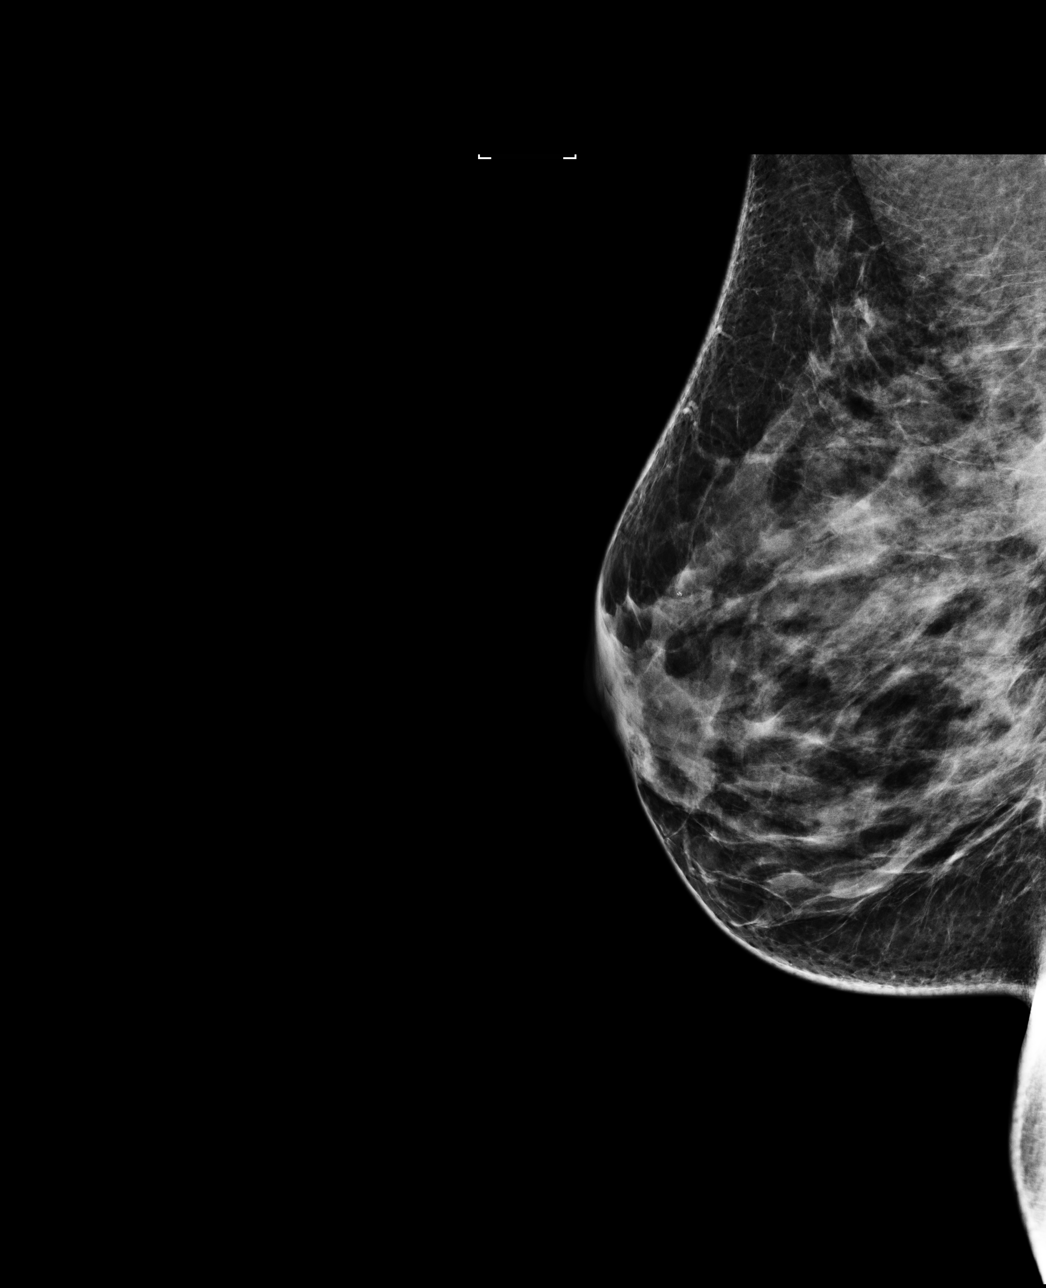

[R CC]
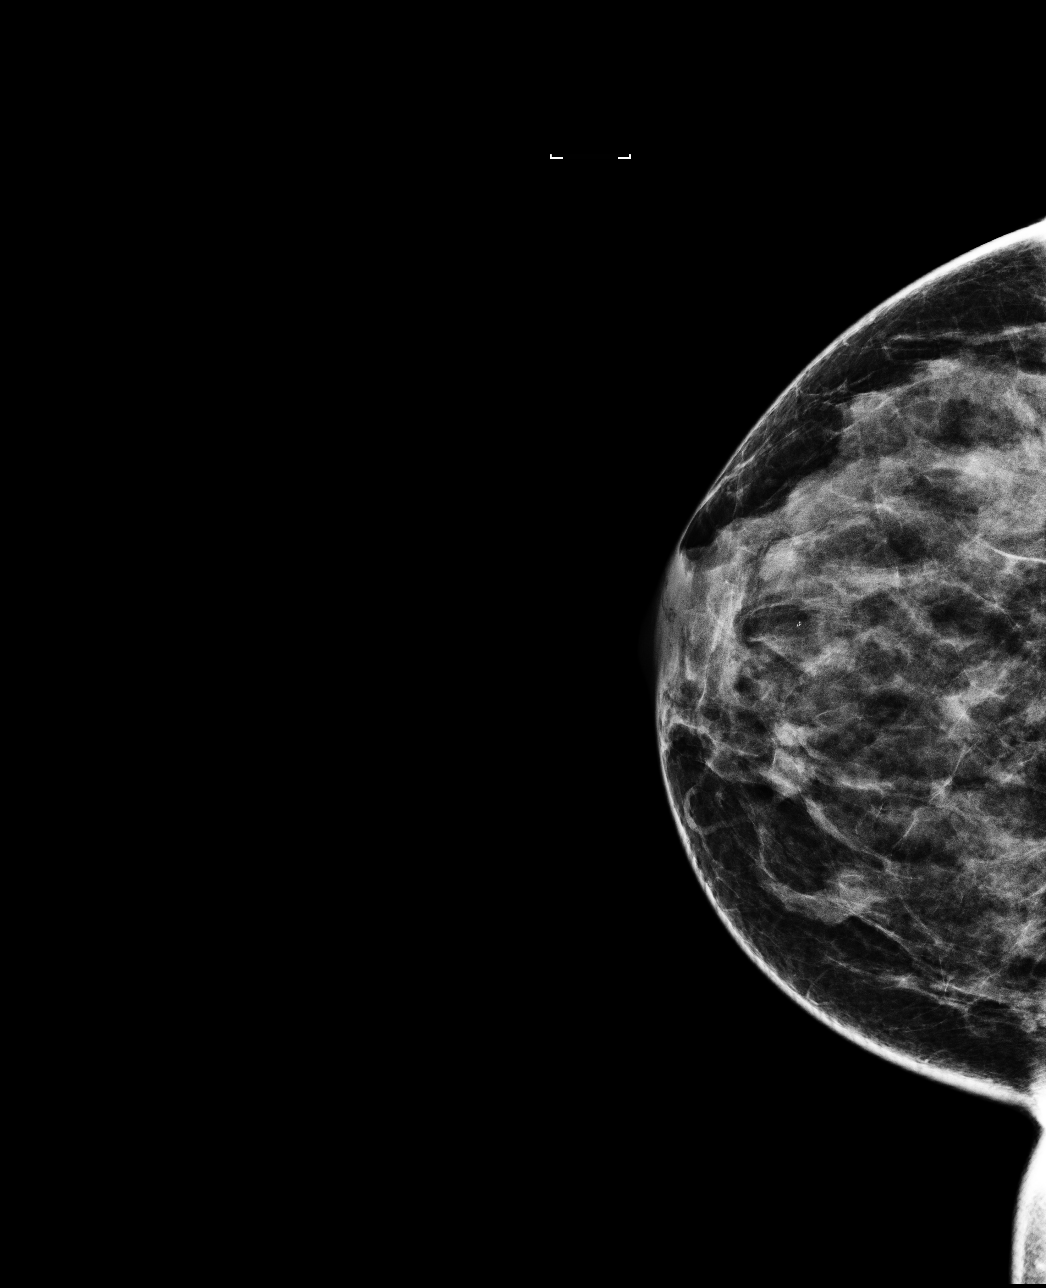

[4 of 4 positions shown; findings below may reference images not displayed]

ACR Breast Density Category c: The breast tissue is heterogeneously
dense, which may obscure small masses.
FINDINGS: There are no findings suspicious for malignancy. Images were
processed with CAD.
IMPRESSION: No mammographic evidence of malignancy. A result letter of this
screening mammogram will be mailed directly to the patient.

RECOMMENDATION:
Screening mammogram in one year. (Code:YJ-2-FEZ)

BI-RADS CATEGORY  1: Negative.

## 2018-07-07 ENCOUNTER — Other Ambulatory Visit: Payer: Self-pay | Admitting: Obstetrics and Gynecology

## 2018-12-16 ENCOUNTER — Other Ambulatory Visit: Payer: Self-pay | Admitting: Family Medicine

## 2018-12-16 DIAGNOSIS — Z1231 Encounter for screening mammogram for malignant neoplasm of breast: Secondary | ICD-10-CM

## 2019-01-26 ENCOUNTER — Ambulatory Visit
Admission: RE | Admit: 2019-01-26 | Discharge: 2019-01-26 | Disposition: A | Discharge status: Home / Self Care | Payer: 59 | Source: Ambulatory Visit | Attending: Family Medicine | Admitting: Family Medicine

## 2019-01-26 DIAGNOSIS — Z1231 Encounter for screening mammogram for malignant neoplasm of breast: Secondary | ICD-10-CM

## 2019-01-26 IMAGING — MG DIGITAL SCREENING BILATERAL MAMMOGRAM WITH CAD
4 series · 4 of 4 positions shown · non-contrast
Comparison: Previous exam(s).

CLINICAL DATA: Screening.

EXAM:
DIGITAL SCREENING BILATERAL MAMMOGRAM WITH CAD

[R CC]
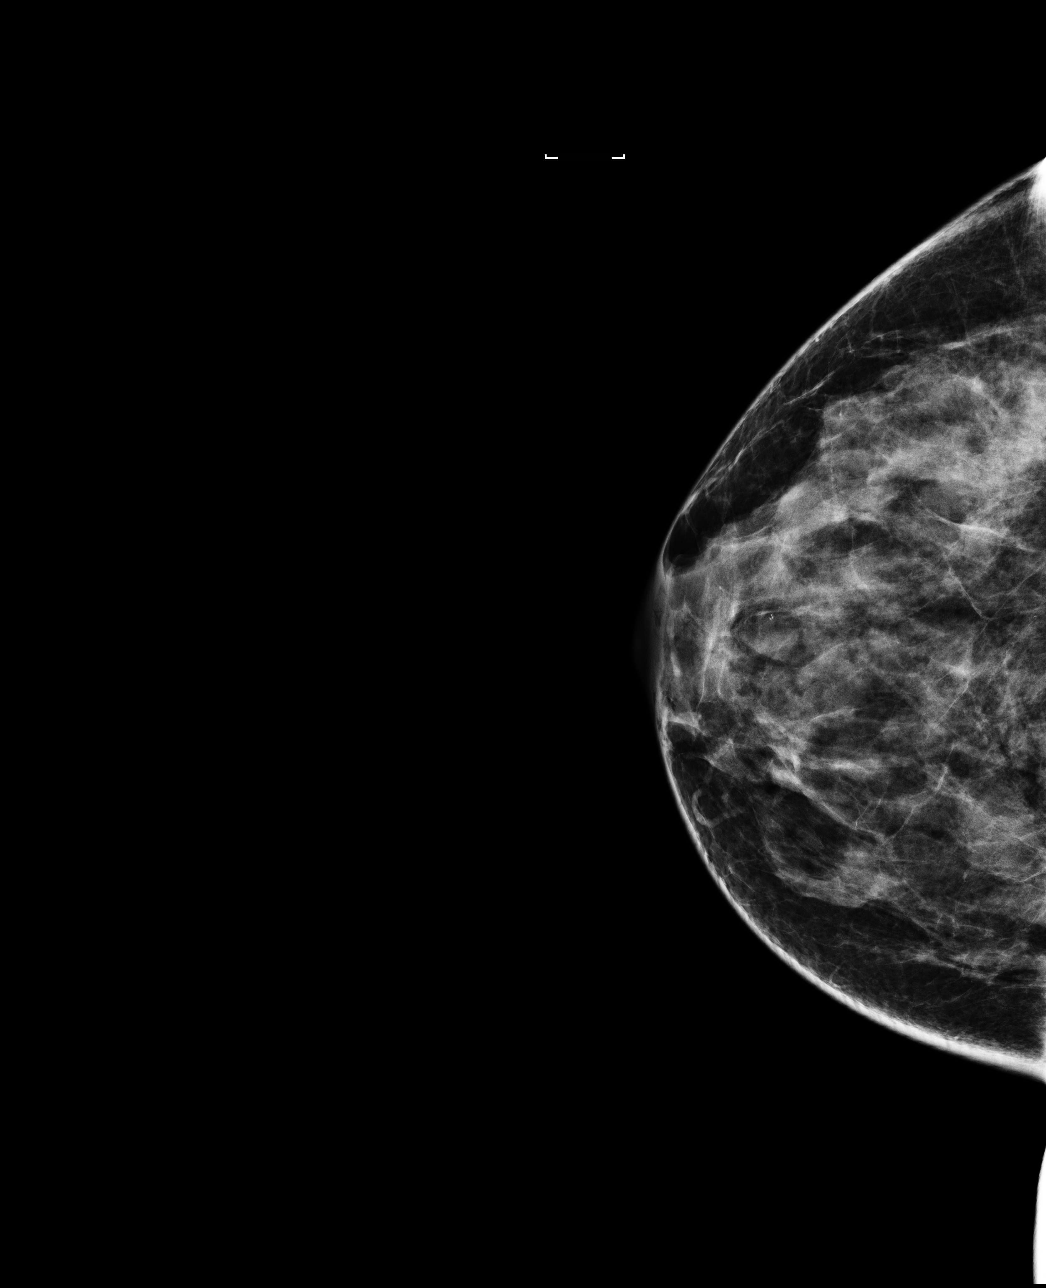

[L CC]
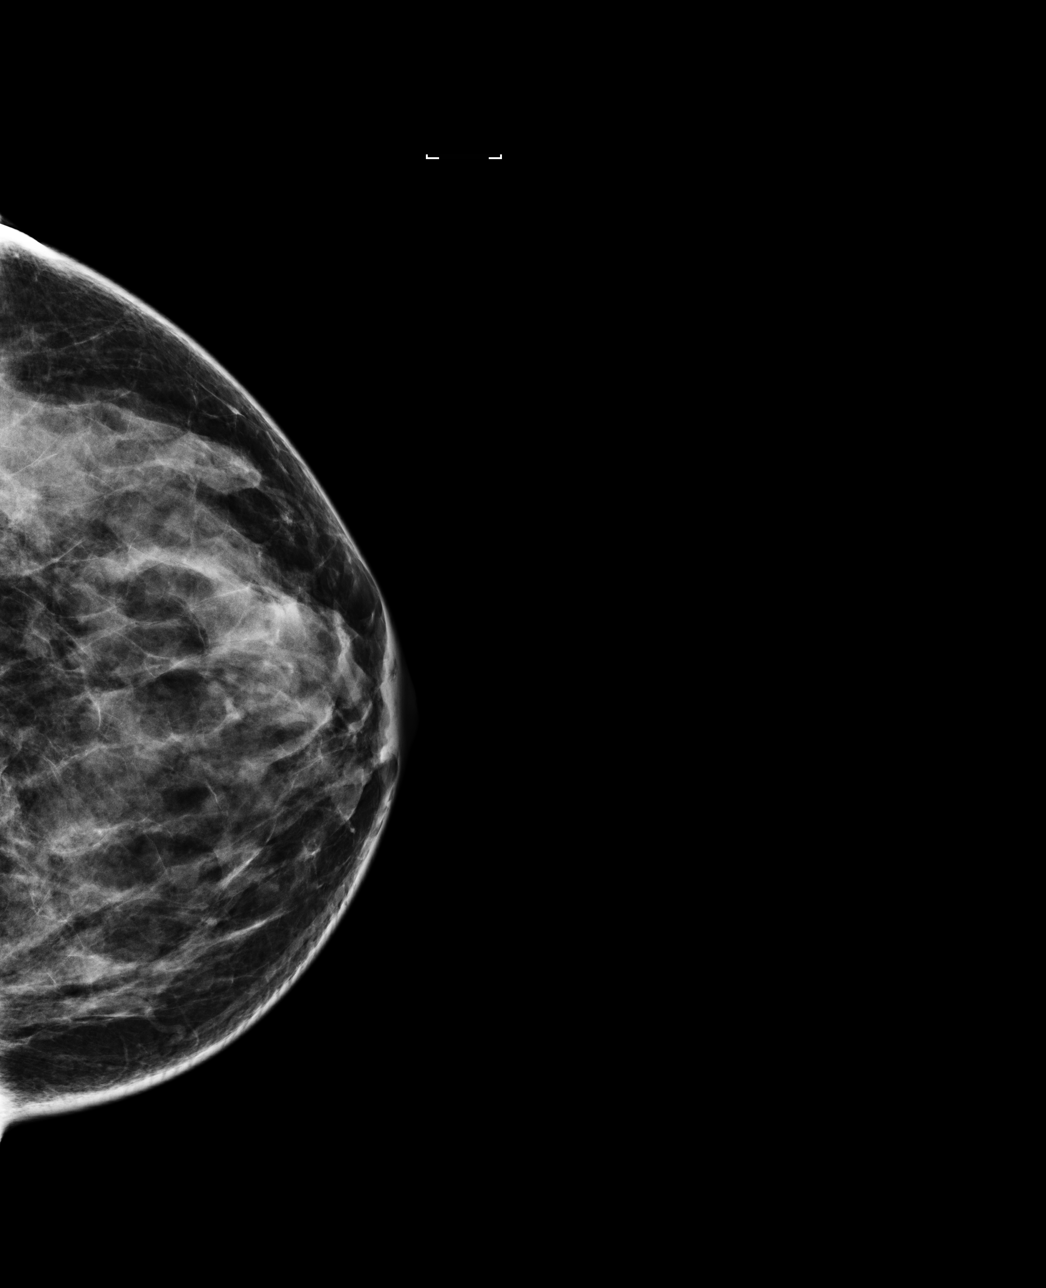

[L MLO]
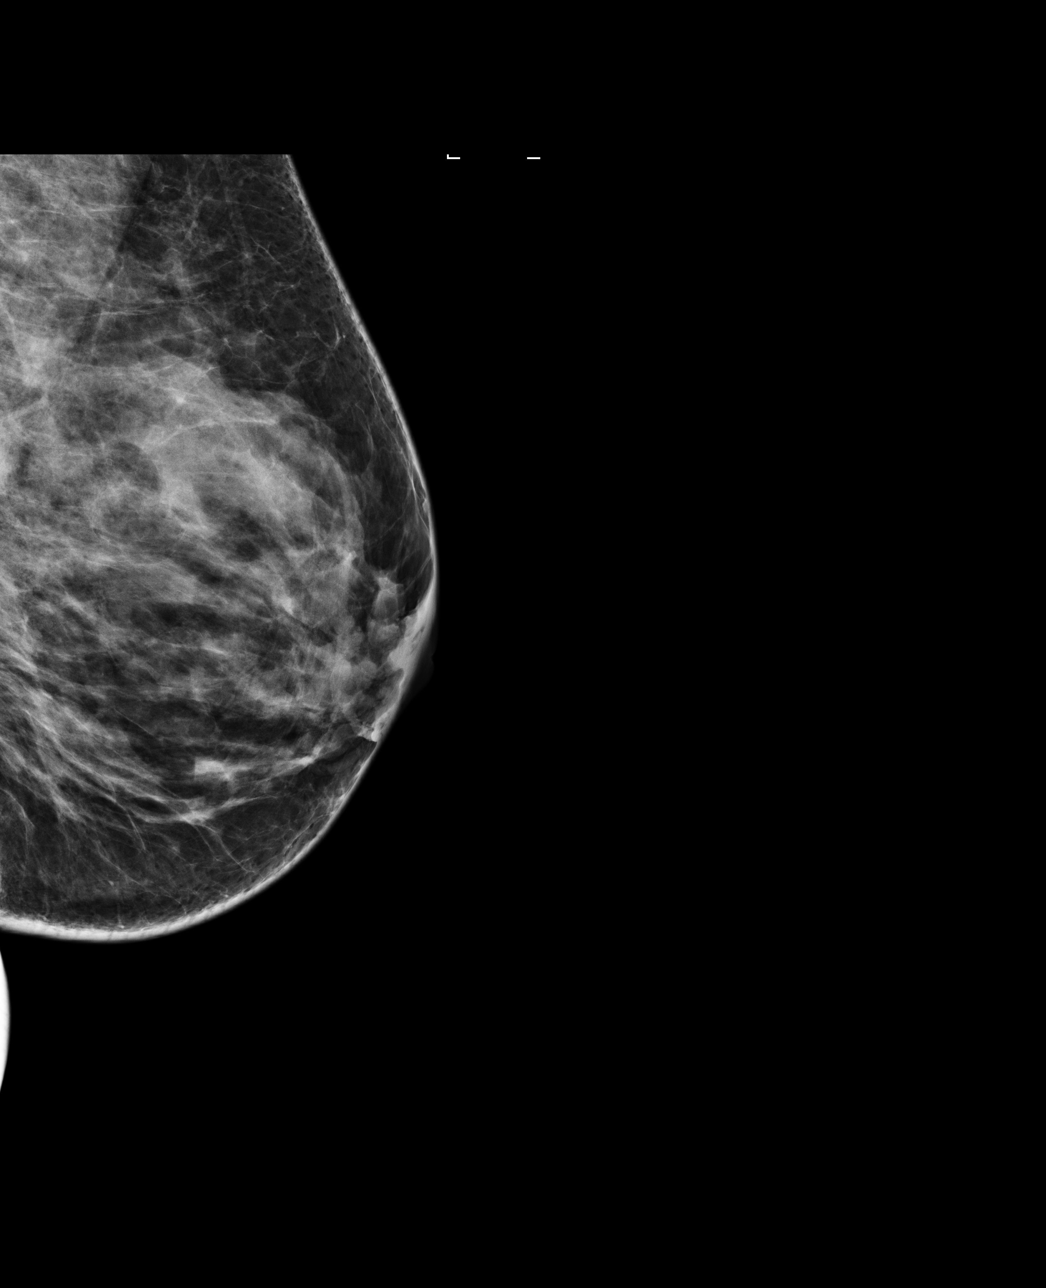

[R MLO]
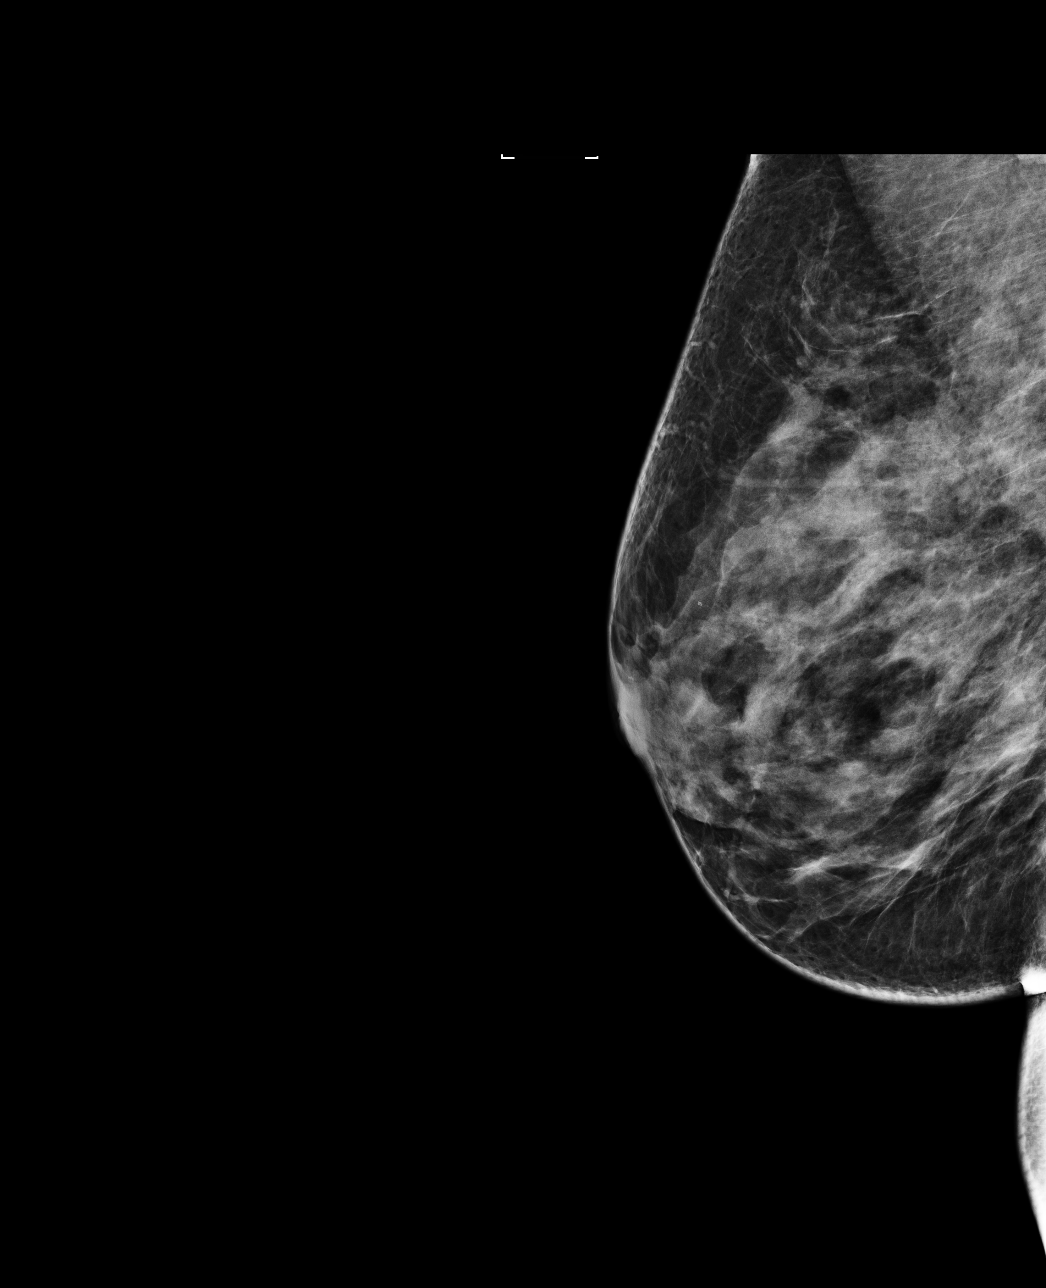

[4 of 4 positions shown; findings below may reference images not displayed]

ACR Breast Density Category c: The breast tissue is heterogeneously
dense, which may obscure small masses.
FINDINGS: There are no findings suspicious for malignancy. Images were
processed with CAD.
IMPRESSION: No mammographic evidence of malignancy. A result letter of this
screening mammogram will be mailed directly to the patient.

RECOMMENDATION:
Screening mammogram in one year. (Code:YJ-2-FEZ)

BI-RADS CATEGORY  1: Negative.

## 2020-01-19 ENCOUNTER — Other Ambulatory Visit: Payer: Self-pay | Admitting: Family Medicine

## 2020-01-19 DIAGNOSIS — Z1231 Encounter for screening mammogram for malignant neoplasm of breast: Secondary | ICD-10-CM

## 2020-02-22 ENCOUNTER — Other Ambulatory Visit (HOSPITAL_COMMUNITY)
Admission: RE | Admit: 2020-02-22 | Discharge: 2020-02-22 | Disposition: A | Discharge status: Home / Self Care | Payer: 59 | Source: Ambulatory Visit | Attending: Family Medicine | Admitting: Family Medicine

## 2020-02-22 ENCOUNTER — Other Ambulatory Visit: Payer: Self-pay

## 2020-02-22 ENCOUNTER — Ambulatory Visit
Admission: RE | Admit: 2020-02-22 | Discharge: 2020-02-22 | Disposition: A | Discharge status: Home / Self Care | Payer: 59 | Source: Ambulatory Visit | Attending: Family Medicine | Admitting: Family Medicine

## 2020-02-22 DIAGNOSIS — Z124 Encounter for screening for malignant neoplasm of cervix: Secondary | ICD-10-CM | POA: Insufficient documentation

## 2020-02-22 DIAGNOSIS — Z1231 Encounter for screening mammogram for malignant neoplasm of breast: Secondary | ICD-10-CM

## 2020-02-22 IMAGING — MG DIGITAL SCREENING BILAT W/ CAD
4 series · 4 of 4 positions shown · non-contrast
Comparison: Previous exam(s).

CLINICAL DATA: Screening.

EXAM:
DIGITAL SCREENING BILATERAL MAMMOGRAM WITH CAD

[L MLO]
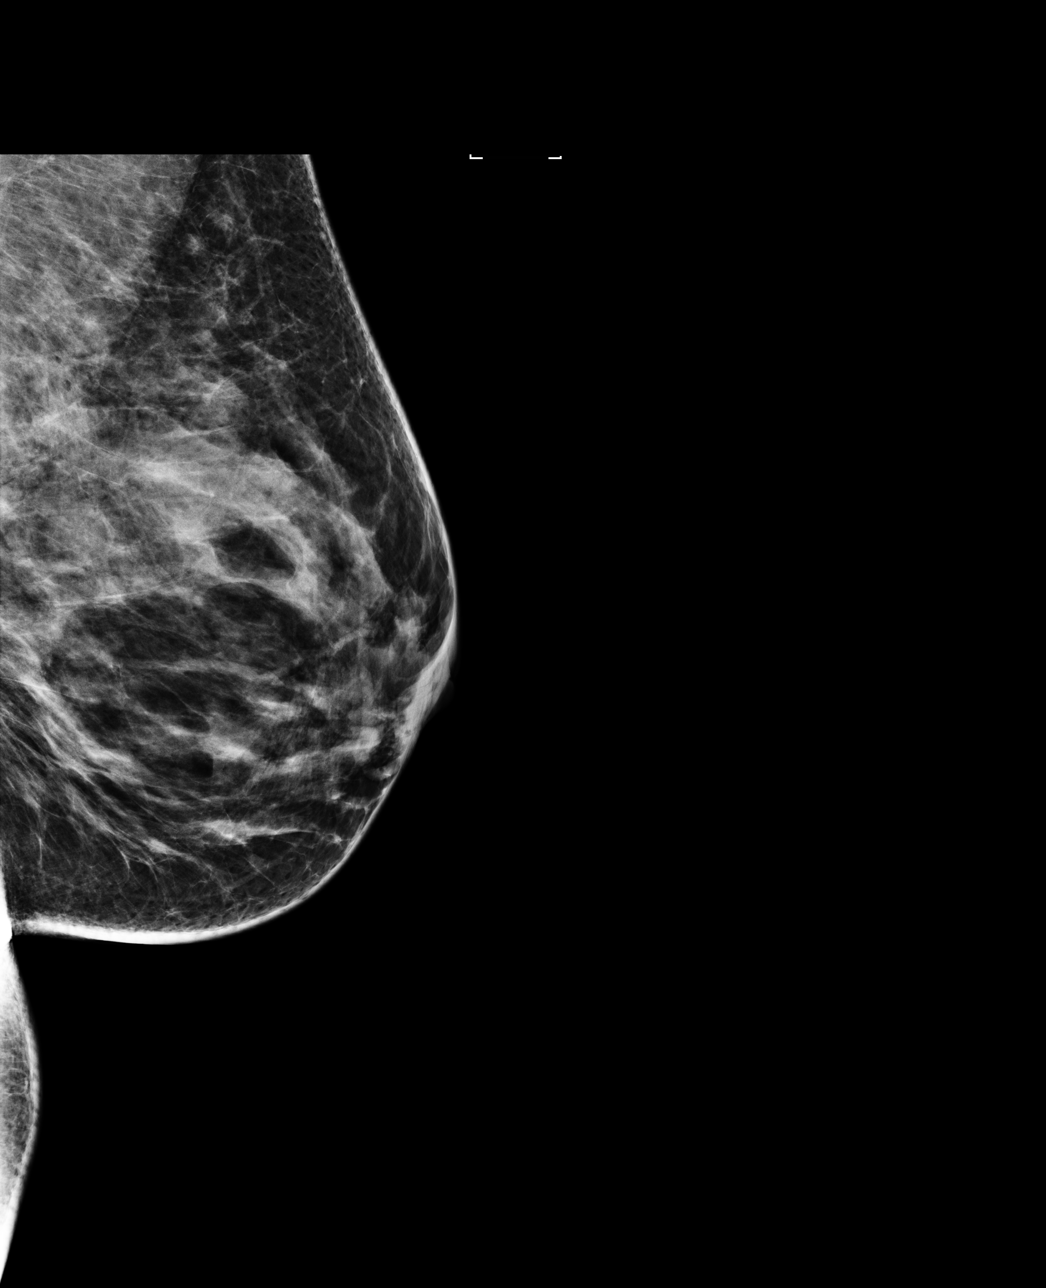

[L CC]
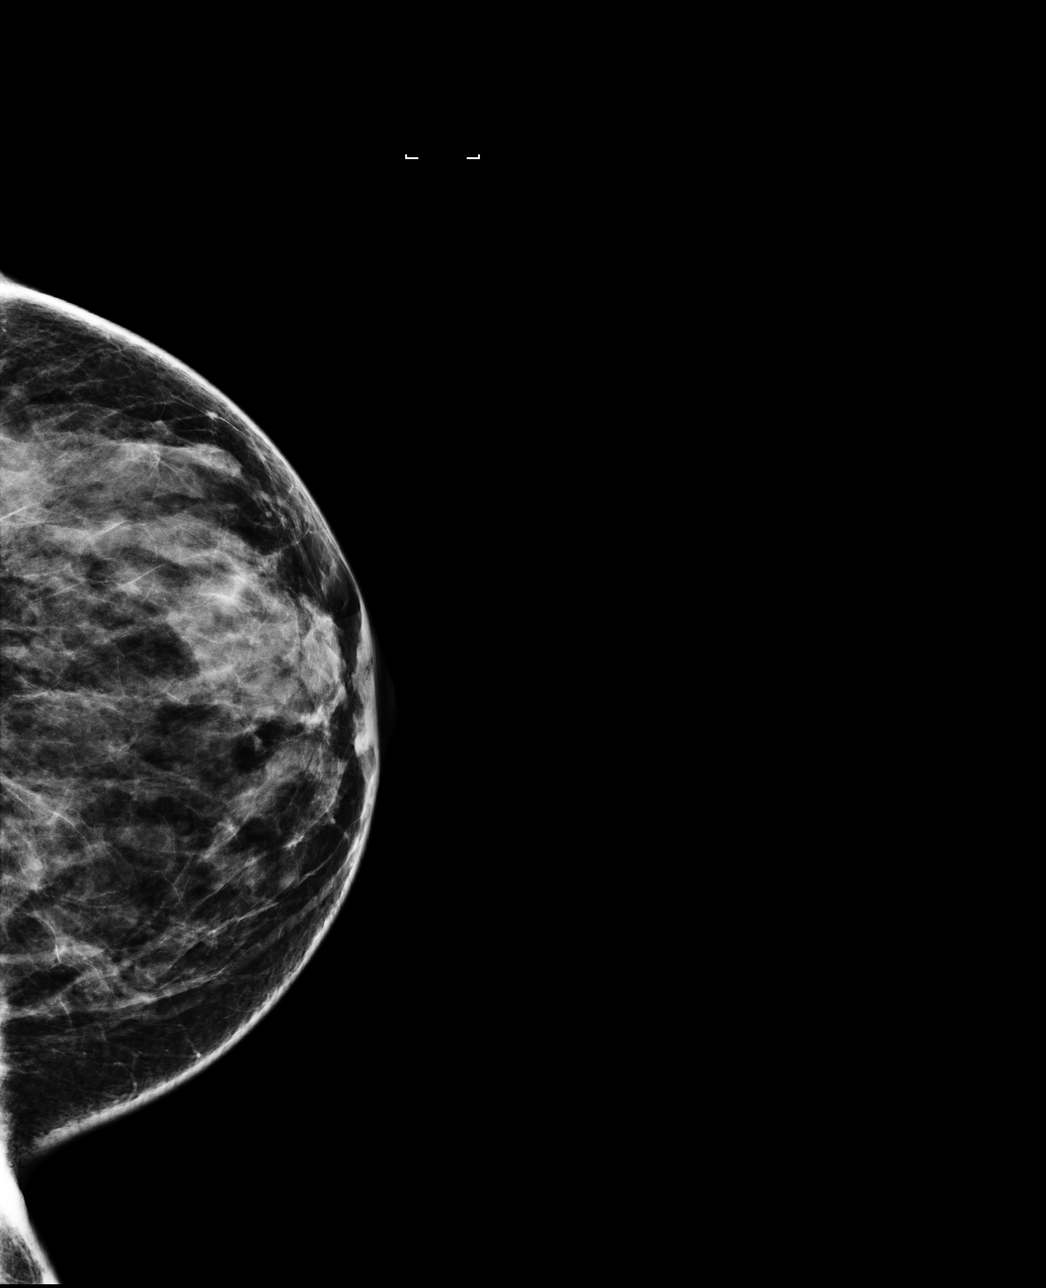

[R CC]
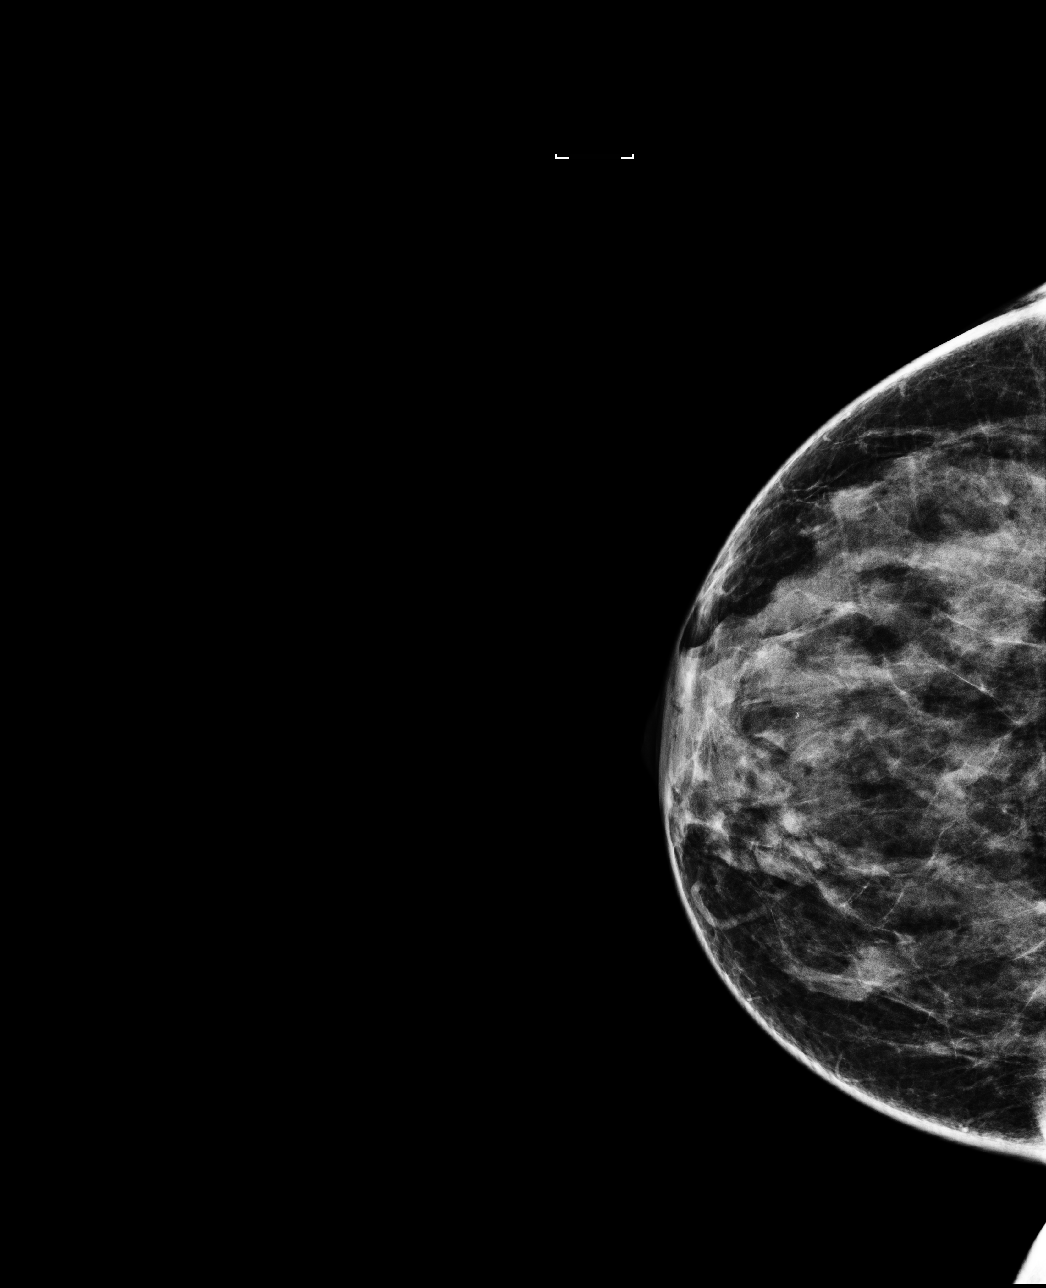

[R MLO]
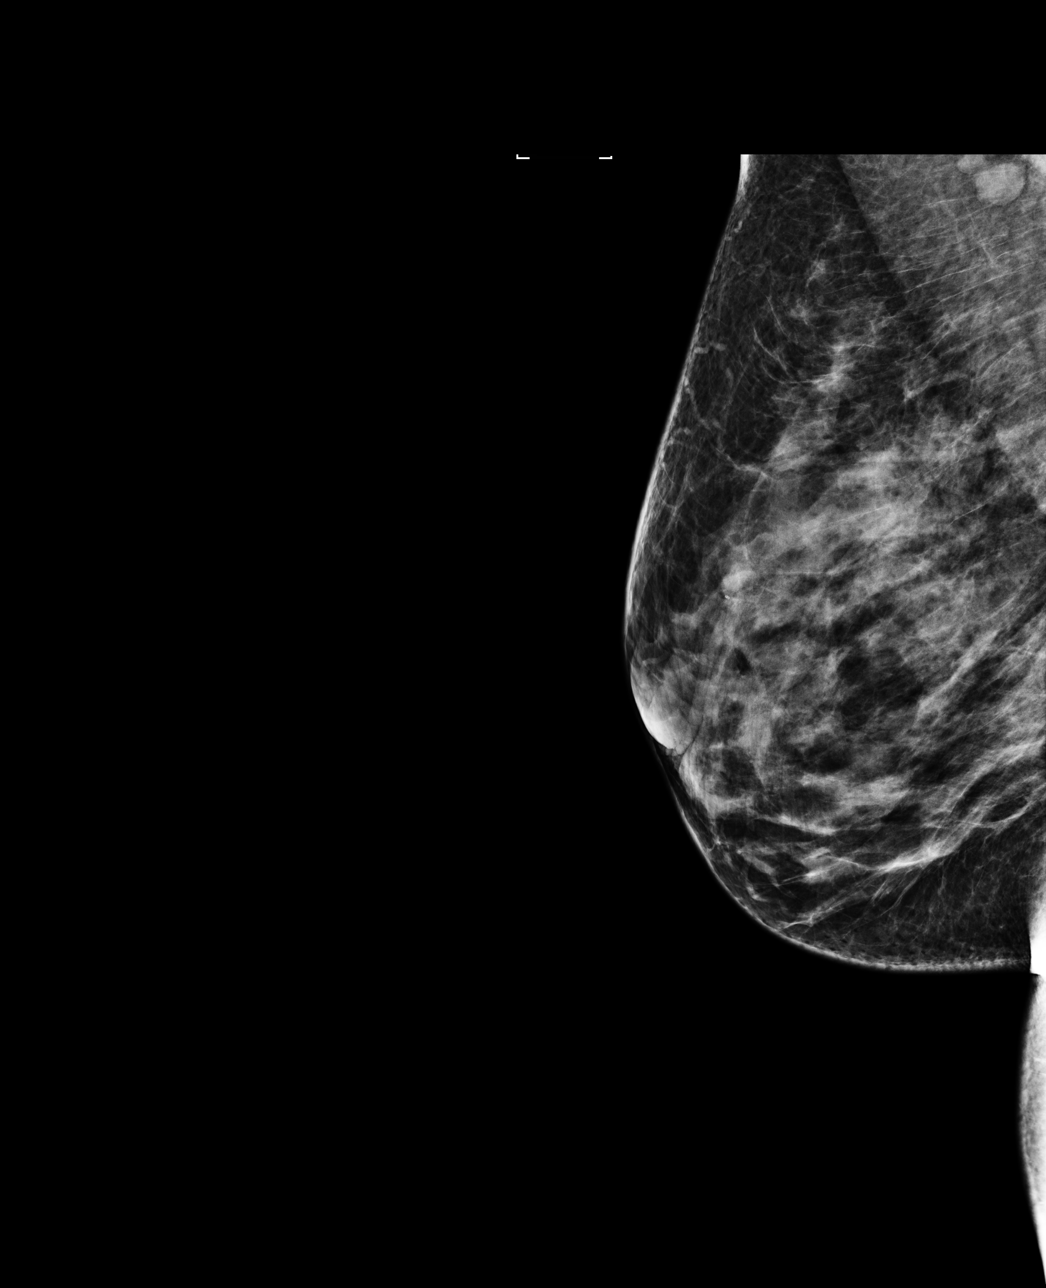

[4 of 4 positions shown; findings below may reference images not displayed]

ACR Breast Density Category c: The breast tissue is heterogeneously
dense, which may obscure small masses.
FINDINGS: There are no findings suspicious for malignancy. Images were
processed with CAD.
IMPRESSION: No mammographic evidence of malignancy. A result letter of this
screening mammogram will be mailed directly to the patient.

RECOMMENDATION:
Screening mammogram in one year. (Code:YJ-2-FEZ)

BI-RADS CATEGORY  1: Negative.

## 2020-02-25 LAB — CYTOLOGY - PAP
Adequacy: ABSENT
Diagnosis: NEGATIVE

## 2020-03-26 ENCOUNTER — Ambulatory Visit: Payer: 59 | Attending: Internal Medicine

## 2020-03-26 DIAGNOSIS — Z23 Encounter for immunization: Secondary | ICD-10-CM

## 2020-03-26 NOTE — Progress Notes (Signed)
   Covid-19 Vaccination Clinic  Name:  Julia Cobb    MRN: [id number] DOB: [birth year]  03/26/2020  [name] was observed post Covid-19 immunization for 30 minutes based on pre-vaccination screening without incident. She was provided with Vaccine Information Sheet and instruction to access the V-Safe system.   [name] was instructed to call 911 with any severe reactions post vaccine: Marland Kitchen Difficulty breathing  . Swelling of face and throat  . A fast heartbeat  . A bad rash all over body  . Dizziness and weakness   Immunizations Administered    Name Date Dose VIS Date Route   Pfizer COVID-19 Vaccine 03/26/2020  4:14 PM 0.3 mL 11/20/2019 Intramuscular   Manufacturer: [employer], [employer]   Lot: [id number]   NDC: [address]-1

## 2020-04-18 ENCOUNTER — Ambulatory Visit: Payer: 59 | Attending: Internal Medicine

## 2020-04-18 DIAGNOSIS — Z23 Encounter for immunization: Secondary | ICD-10-CM

## 2020-04-18 NOTE — Progress Notes (Signed)
   Covid-19 Vaccination Clinic  Name:  Julia Cobb    MRN: [id number] DOB: July 07, 1976  04/18/2020  [name] was observed post Covid-19 immunization for 15 minutes without incident. She was provided with Vaccine Information Sheet and instruction to access the V-Safe system.   [name] was instructed to call 911 with any severe reactions post vaccine: Marland Kitchen Difficulty breathing  . Swelling of face and throat  . A fast heartbeat  . A bad rash all over body  . Dizziness and weakness   Immunizations Administered    Name Date Dose VIS Date Route   Pfizer COVID-19 Vaccine 04/18/2020 10:34 AM 0.3 mL 02/03/2019 Intramuscular   Manufacturer: [employer], [employer]   Lot: [id number]   NDC: [address]-2

## 2020-09-16 ENCOUNTER — Other Ambulatory Visit: Payer: 59

## 2020-09-16 DIAGNOSIS — Z20822 Contact with and (suspected) exposure to covid-19: Secondary | ICD-10-CM

## 2020-09-17 LAB — NOVEL CORONAVIRUS, NAA: SARS-CoV-2, NAA: NOT DETECTED

## 2020-09-17 LAB — SARS-COV-2, NAA 2 DAY TAT

## 2020-12-17 ENCOUNTER — Ambulatory Visit: Payer: 59 | Attending: Internal Medicine

## 2020-12-17 DIAGNOSIS — Z23 Encounter for immunization: Secondary | ICD-10-CM

## 2020-12-17 NOTE — Progress Notes (Signed)
   Covid-19 Vaccination Clinic  Name:  Julia Cobb    MRN: [id number] DOB: [birth year]  12/17/2020  [name] was observed post Covid-19 immunization for 15 minutes without incident. She was provided with Vaccine Information Sheet and instruction to access the V-Safe system.   [name] was instructed to call 911 with any severe reactions post vaccine: Marland Kitchen Difficulty breathing  . Swelling of face and throat  . A fast heartbeat  . A bad rash all over body  . Dizziness and weakness   Immunizations Administered    Name Date Dose VIS Date Route   Pfizer COVID-19 Vaccine 12/17/2020 10:33 AM 0.3 mL 09/28/2020 Intramuscular   Manufacturer: [employer], [employer]   Lot: [id number]   NDC: [address]-6

## 2021-01-23 ENCOUNTER — Other Ambulatory Visit: Payer: Self-pay | Admitting: Family Medicine

## 2021-01-23 DIAGNOSIS — Z1231 Encounter for screening mammogram for malignant neoplasm of breast: Secondary | ICD-10-CM

## 2021-02-23 ENCOUNTER — Ambulatory Visit
Admission: RE | Admit: 2021-02-23 | Discharge: 2021-02-23 | Disposition: A | Discharge status: Home / Self Care | Payer: 59 | Source: Ambulatory Visit

## 2021-02-23 ENCOUNTER — Other Ambulatory Visit: Payer: Self-pay

## 2021-02-23 DIAGNOSIS — Z1231 Encounter for screening mammogram for malignant neoplasm of breast: Secondary | ICD-10-CM

## 2021-02-23 IMAGING — MG MM DIGITAL SCREENING BILAT W/ TOMO AND CAD
8 series · 9 of 24 positions shown · non-contrast
Comparison: Previous exam(s).

CLINICAL DATA: Screening.

EXAM:
DIGITAL SCREENING BILATERAL MAMMOGRAM WITH TOMOSYNTHESIS AND CAD
TECHNIQUE: Bilateral screening digital craniocaudal and mediolateral oblique
mammograms were obtained. Bilateral screening digital breast
tomosynthesis was performed. The images were evaluated with
computer-aided detection.

[R CC synth-2D]
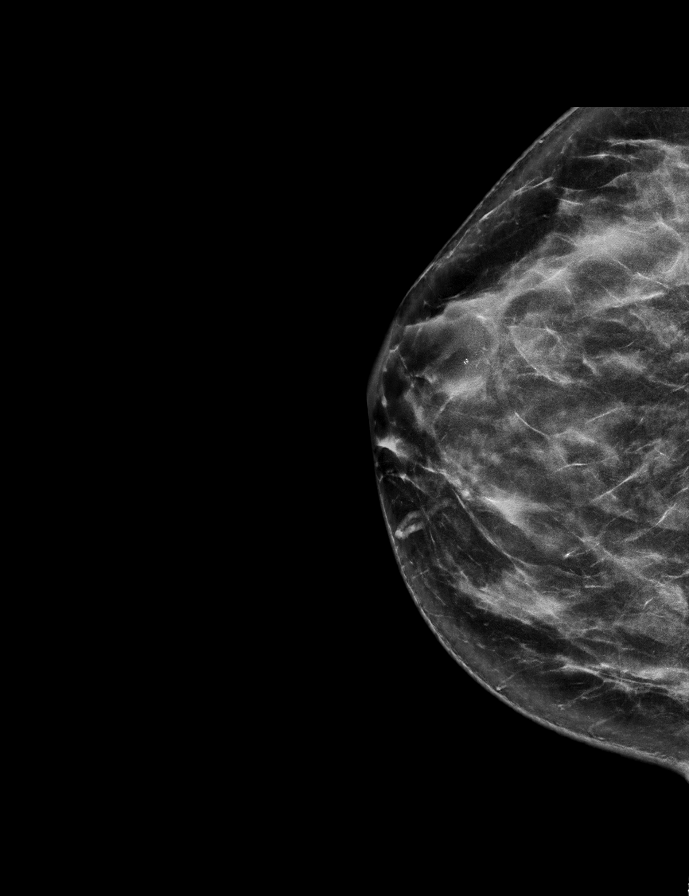

[L CC synth-2D]
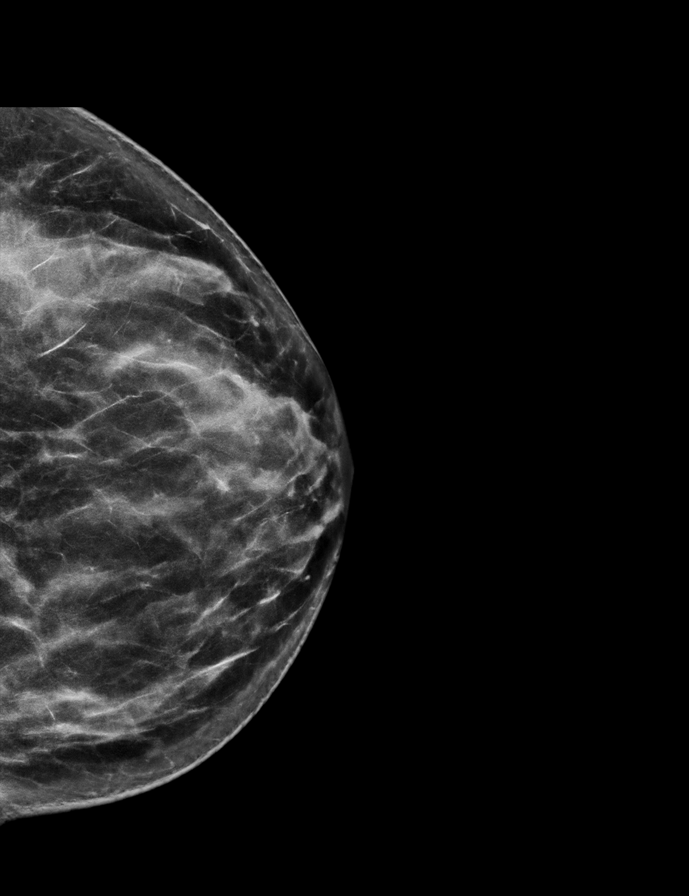

[R MLO synth-2D]
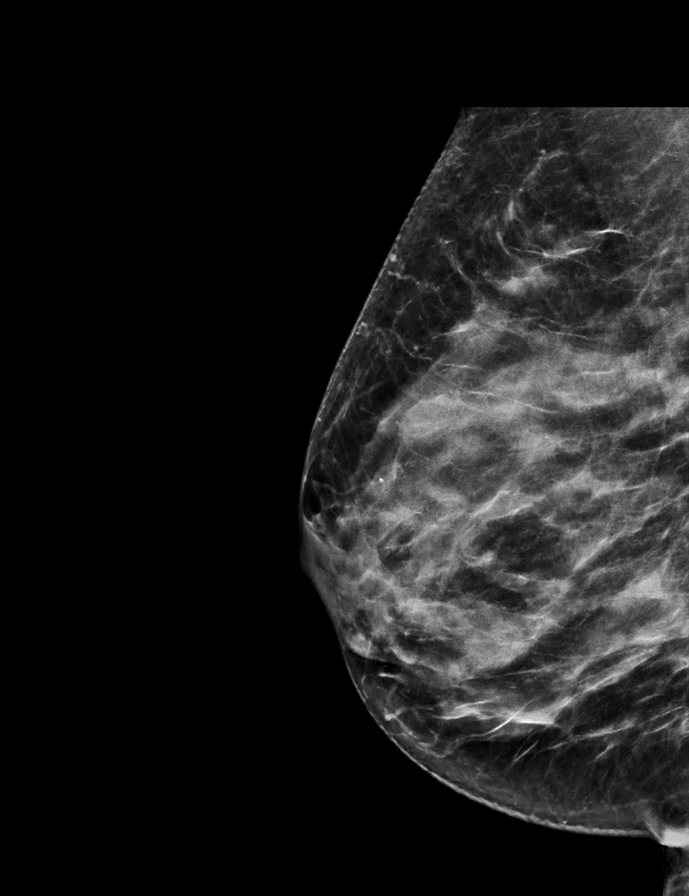

[L MLO synth-2D]
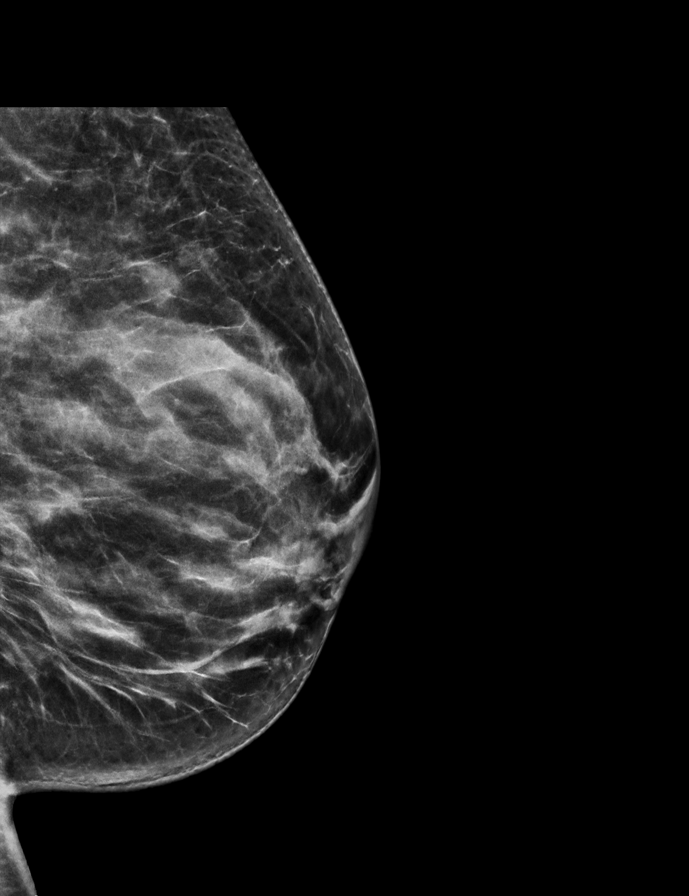

[R MLO tomo · 2 of 69 frames shown]
[frame 23/69]
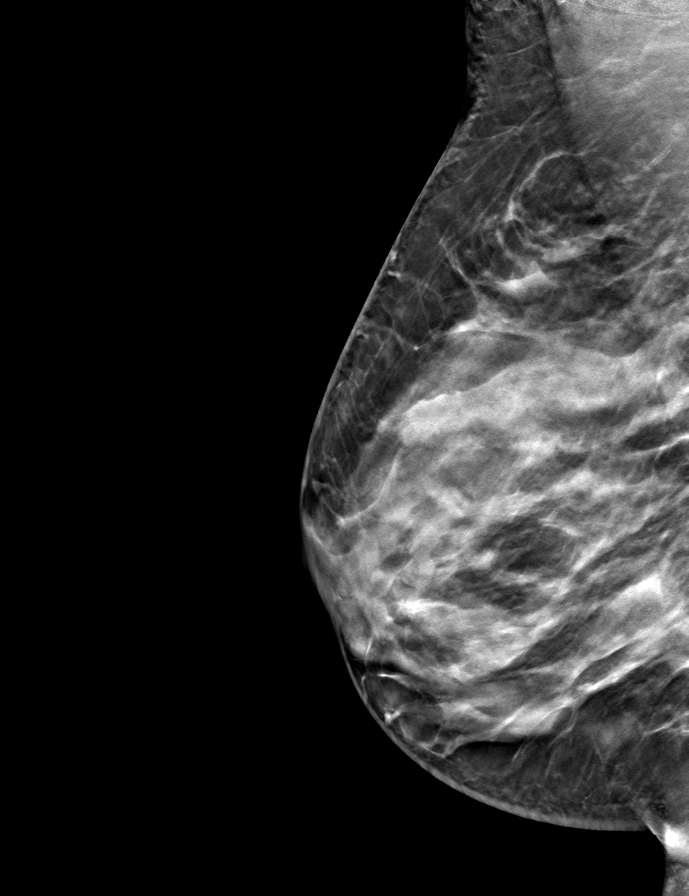
[frame 35/69]
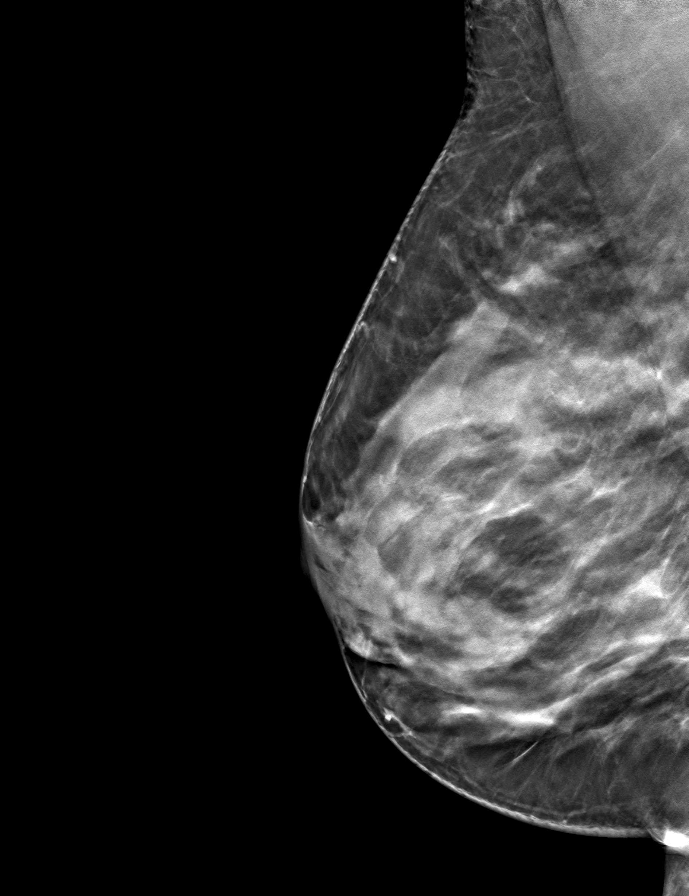

[L MLO tomo · tomo slice 33/65.0]
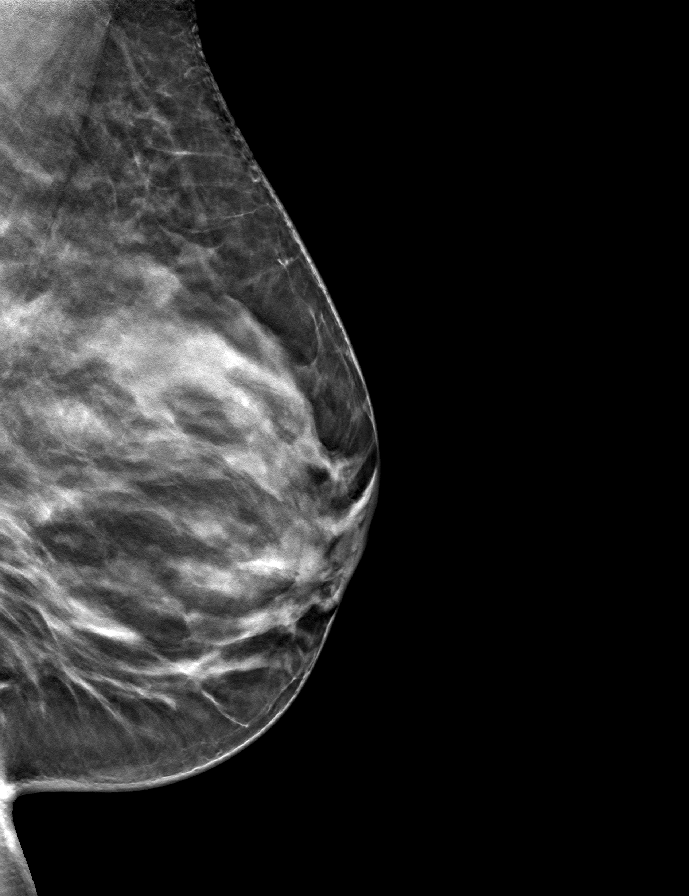

[R CC tomo · tomo slice 34/67.0]
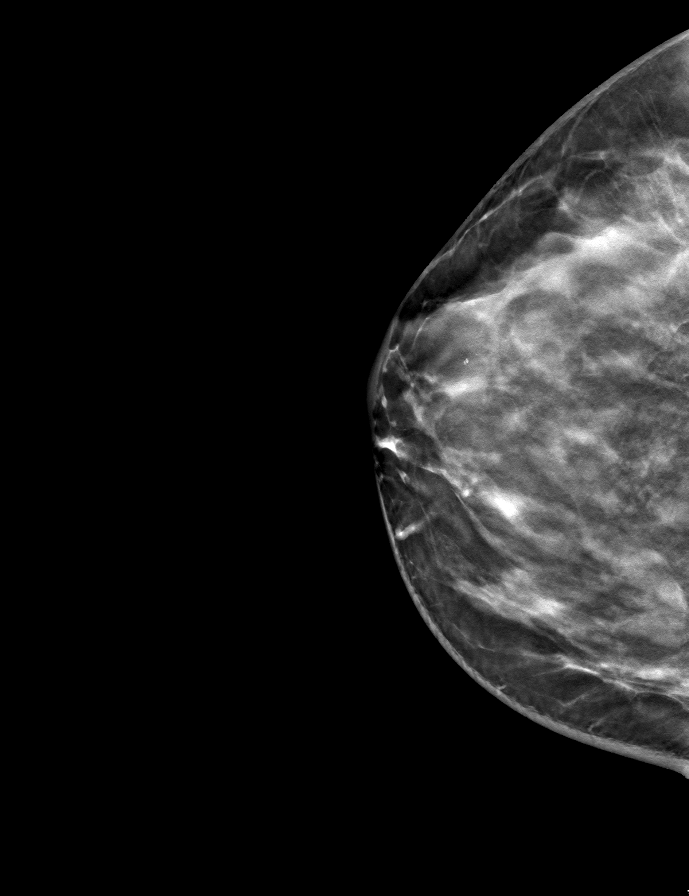

[L CC tomo · tomo slice 34/67.0]
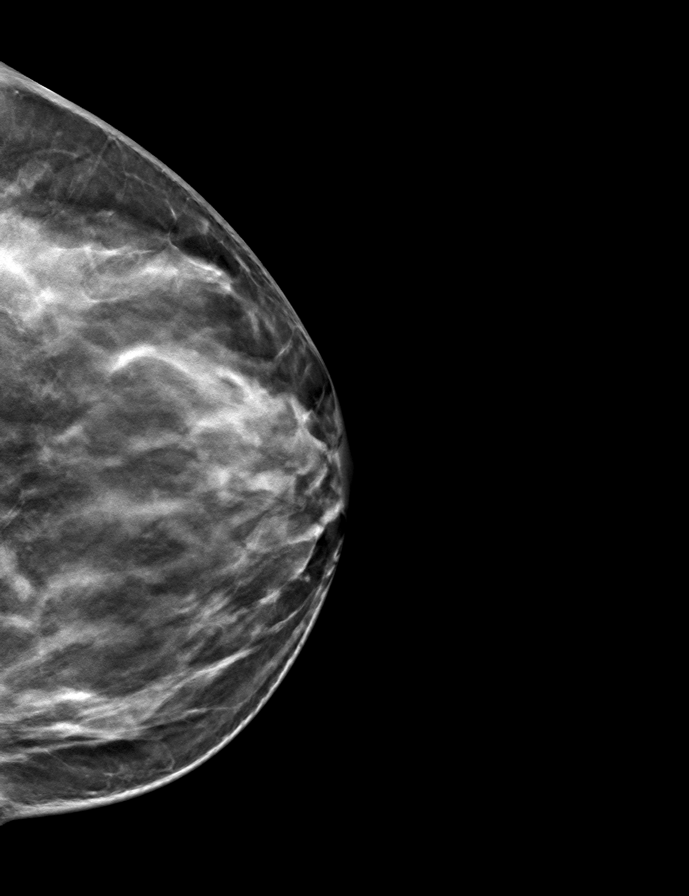

[9 of 24 positions shown; findings below may reference images not displayed]

ACR Breast Density Category c: The breast tissue is heterogeneously
dense, which may obscure small masses.
FINDINGS: There are no findings suspicious for malignancy. The images were
evaluated with computer-aided detection.
IMPRESSION: No mammographic evidence of malignancy. A result letter of this
screening mammogram will be mailed directly to the patient.

RECOMMENDATION:
Screening mammogram in one year. (Code:T4-5-GWO)

BI-RADS CATEGORY  1: Negative.

## 2021-10-10 HISTORY — PX: COLONOSCOPY: SHX174

## 2022-01-10 ENCOUNTER — Other Ambulatory Visit: Payer: Self-pay | Admitting: Family Medicine

## 2022-01-10 DIAGNOSIS — Z1231 Encounter for screening mammogram for malignant neoplasm of breast: Secondary | ICD-10-CM

## 2022-02-26 ENCOUNTER — Ambulatory Visit
Admission: RE | Admit: 2022-02-26 | Discharge: 2022-02-26 | Disposition: A | Discharge status: Home / Self Care | Payer: 59 | Source: Ambulatory Visit | Attending: Family Medicine | Admitting: Family Medicine

## 2022-02-26 DIAGNOSIS — Z1231 Encounter for screening mammogram for malignant neoplasm of breast: Secondary | ICD-10-CM

## 2022-02-26 IMAGING — MG MM DIGITAL SCREENING BILAT W/ TOMO AND CAD
8 series · 9 of 24 positions shown · non-contrast
Comparison: Previous exam(s).

CLINICAL DATA: Screening.

EXAM:
DIGITAL SCREENING BILATERAL MAMMOGRAM WITH TOMOSYNTHESIS AND CAD
TECHNIQUE: Bilateral screening digital craniocaudal and mediolateral oblique
mammograms were obtained. Bilateral screening digital breast
tomosynthesis was performed. The images were evaluated with
computer-aided detection.

[L CC synth-2D]
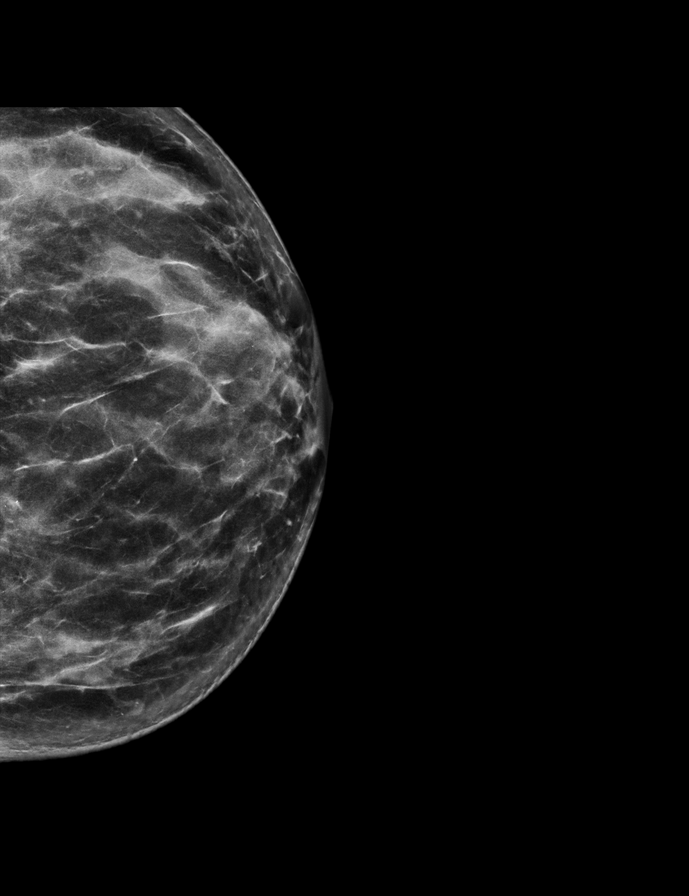

[R MLO synth-2D]
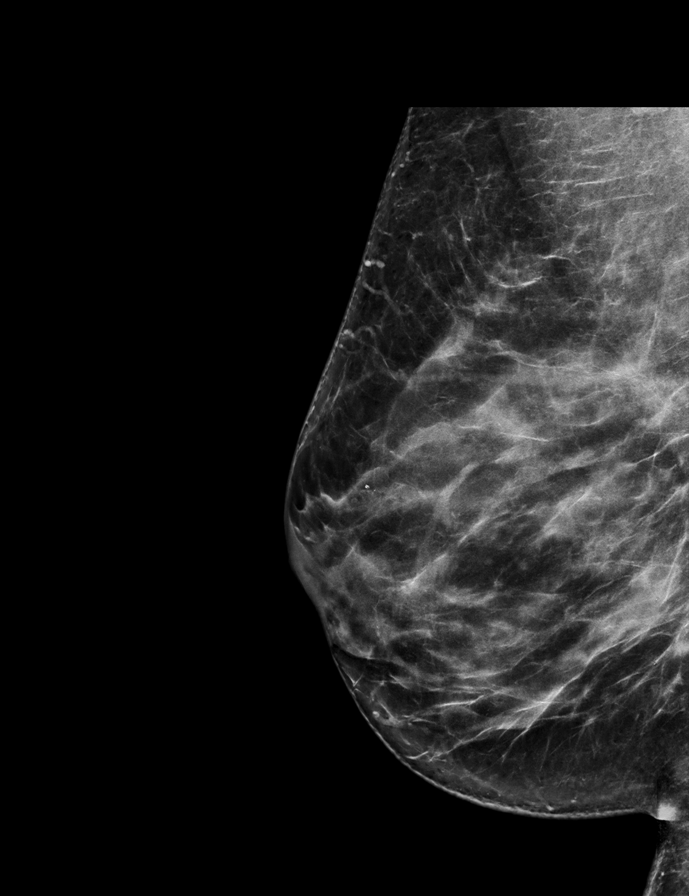

[R CC synth-2D]
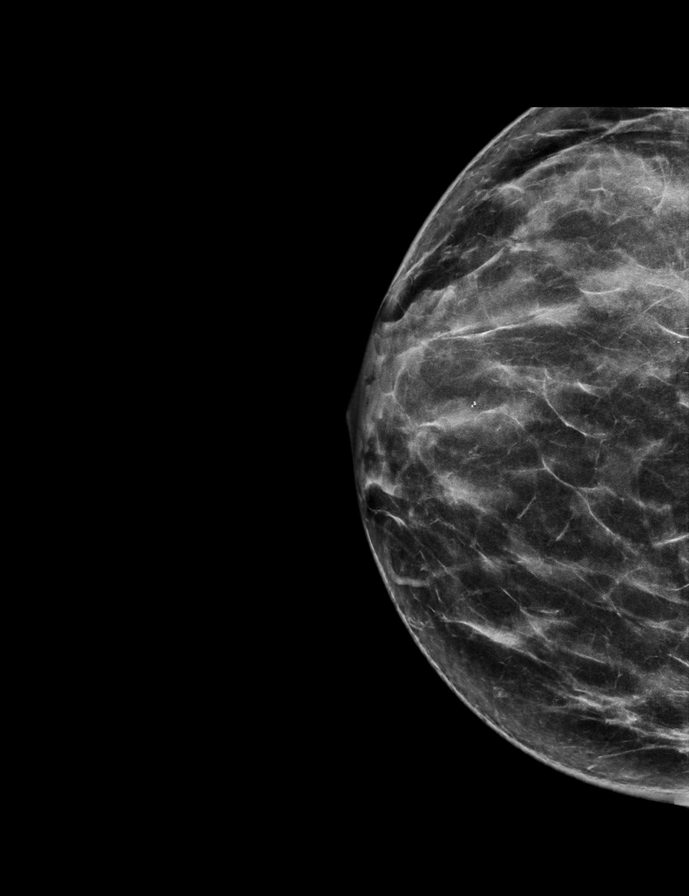

[L MLO synth-2D]
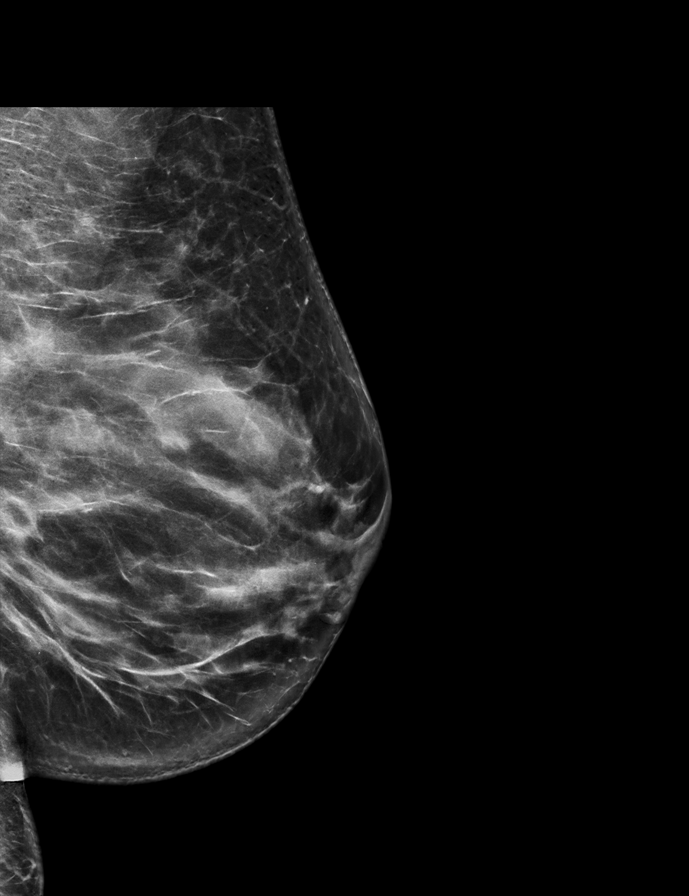

[L MLO tomo · 2 of 81 frames shown]
[frame 27/81]
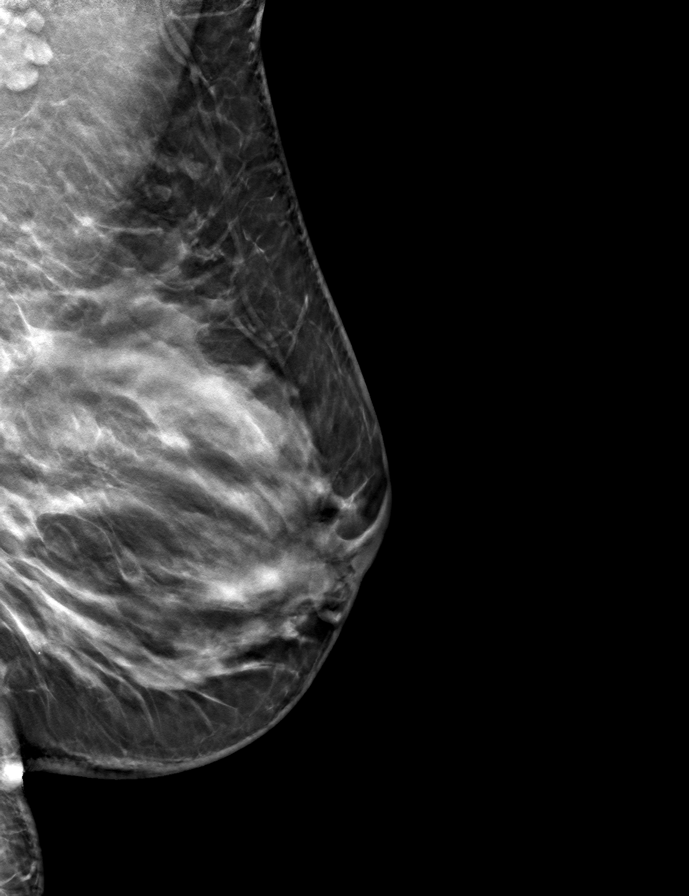
[frame 41/81]
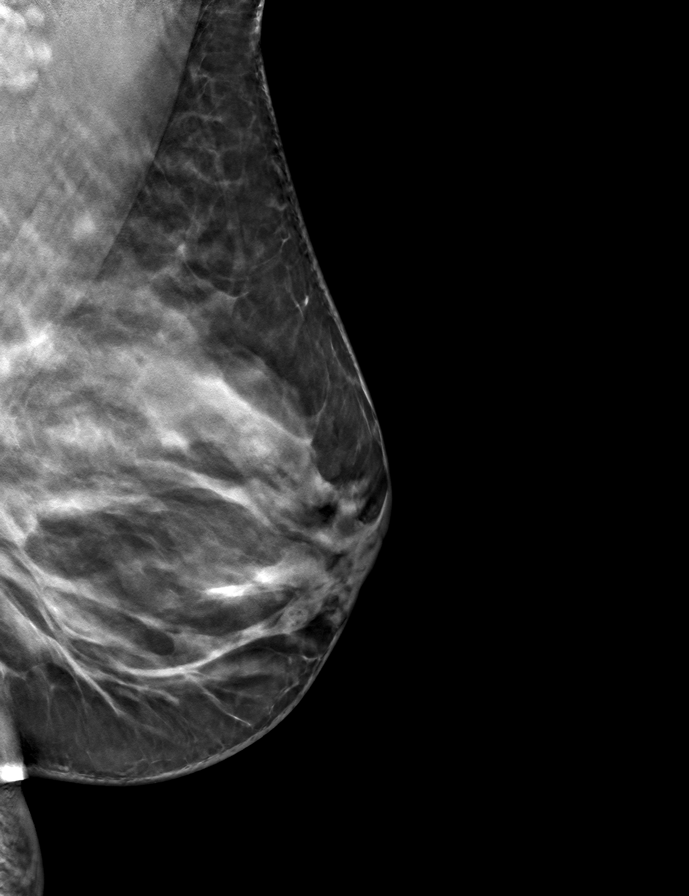

[R MLO tomo · tomo slice 40/79.0]
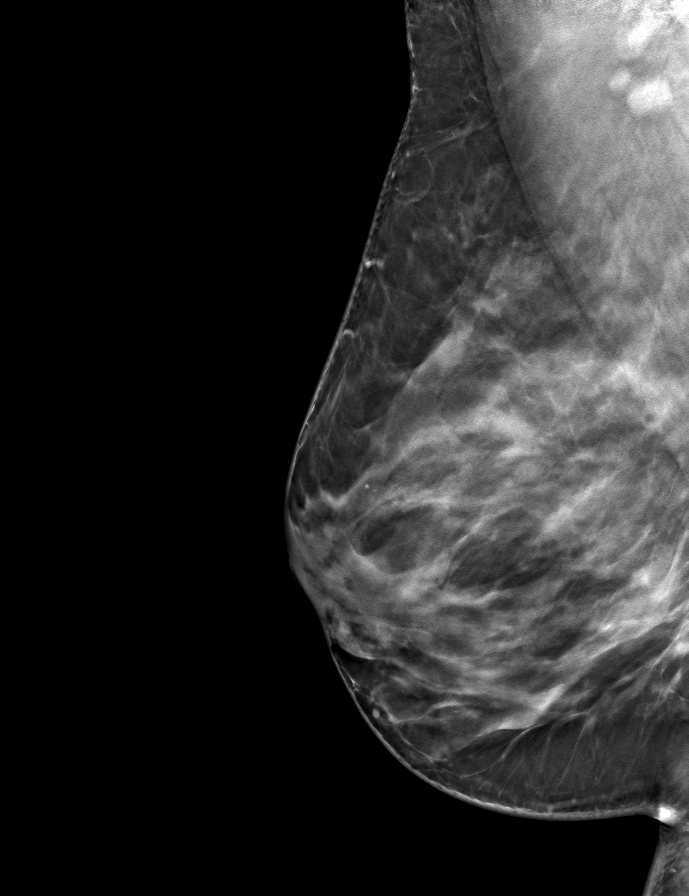

[L CC tomo · tomo slice 33/66.0]
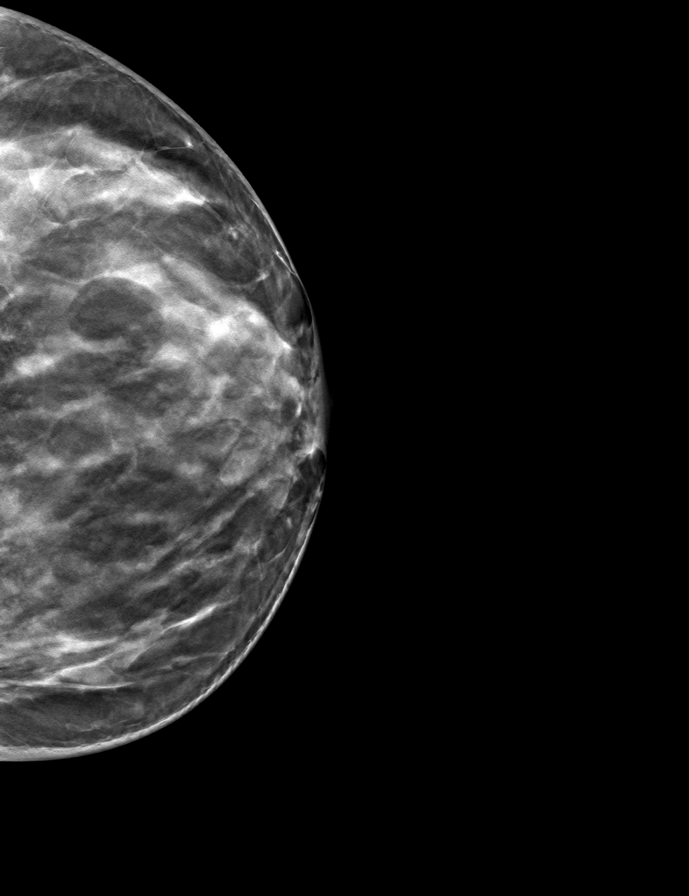

[R CC tomo · tomo slice 33/66.0]
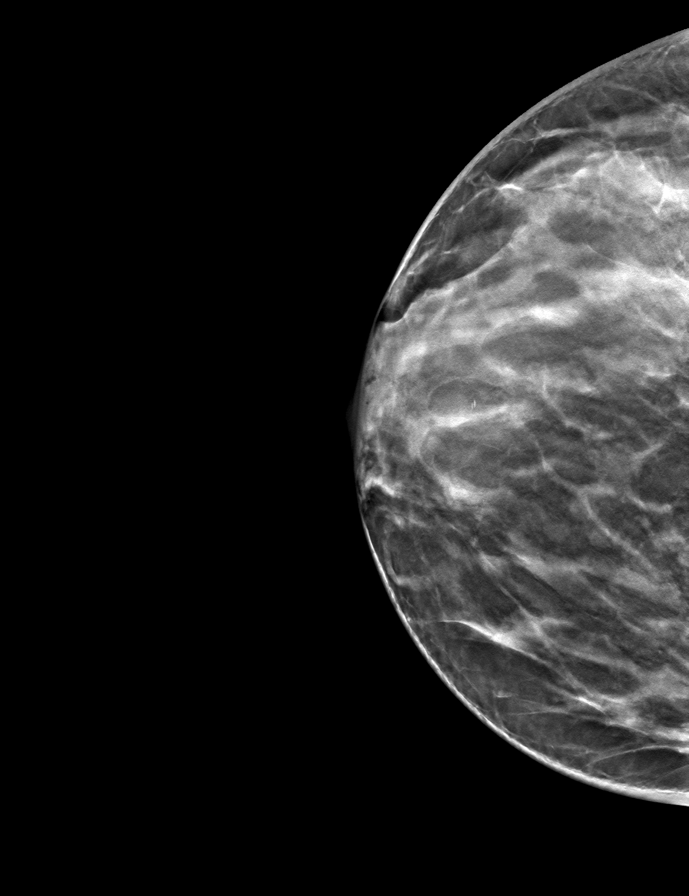

[9 of 24 positions shown; findings below may reference images not displayed]

ACR Breast Density Category c: The breast tissue is heterogeneously
dense, which may obscure small masses.
FINDINGS: There are no findings suspicious for malignancy.
IMPRESSION: No mammographic evidence of malignancy. A result letter of this
screening mammogram will be mailed directly to the patient.

RECOMMENDATION:
Screening mammogram in one year. (Code:Q3-W-BC3)

BI-RADS CATEGORY  1: Negative.

## 2022-05-21 ENCOUNTER — Other Ambulatory Visit: Payer: Self-pay

## 2023-01-17 ENCOUNTER — Other Ambulatory Visit: Payer: Self-pay | Admitting: Internal Medicine

## 2023-01-17 DIAGNOSIS — Z1231 Encounter for screening mammogram for malignant neoplasm of breast: Secondary | ICD-10-CM

## 2023-03-20 ENCOUNTER — Ambulatory Visit
Admission: RE | Admit: 2023-03-20 | Discharge: 2023-03-20 | Disposition: A | Discharge status: Home / Self Care | Payer: 59 | Source: Ambulatory Visit | Attending: Internal Medicine | Admitting: Internal Medicine

## 2023-03-20 DIAGNOSIS — Z1231 Encounter for screening mammogram for malignant neoplasm of breast: Secondary | ICD-10-CM

## 2024-02-04 ENCOUNTER — Other Ambulatory Visit: Payer: Self-pay | Admitting: Internal Medicine

## 2024-02-04 DIAGNOSIS — Z1231 Encounter for screening mammogram for malignant neoplasm of breast: Secondary | ICD-10-CM

## 2024-04-03 NOTE — Progress Notes (Unsigned)
 [facility] CONSULT NOTE  Patient Care Team: Norlene Beavers, MD (Inactive) as PCP - General (Family Medicine)  ASSESSMENT & PLAN:  Julia Cobb is a 48 y.o. female with history of abnormal uterine bleeding, fibroids, iron deficiency being seen for iron deficiency anemia.  Most recent ferritin was only 4. Therefore does have iron deficiency. Anemic. Will repeat labs in about 2 months after her surgery and iron replacement.  The mechanism of IDA is due to either blood loss or decreased absorptive mechanism or both. We discussed some of the risks, benefits, and alternatives of intravenous iron infusions. The patient is symptomatic from anemia and the iron level is critically low. She tolerated oral iron supplement poorly and desires to achieved higher levels of iron faster for adequate hematopoesis. Some of the side-effects to be expected including risks of infusion reactions, phlebitis, headaches, nausea and fatigue.  The patient is willing to proceed. Patient education material was dispensed.   Ordering iv iron to be given at [address]  Assessment & Plan Iron deficiency anemia due to chronic blood loss  IV iron feraheme ordered. CBC, ferritin, b12 and folate in about 8 weeks Follow up with Gyn for hysterectomy Follow up with me in about 8-9 weeks, 1-2 days after labs Normocytic anemia Adding folate, MMA and b12 on next blood draw  Orders Placed This Encounter  Procedures   CBC with Differential (Cancer Center Only)    Standing Status:   Future    Expiration Date:   04/07/2025   Ferritin    Standing Status:   Future    Expiration Date:   04/07/2025   Iron and Iron Binding Capacity ([facility] only)    Standing Status:   Future    Expiration Date:   04/07/2025   Folate    Standing Status:   Future    Expiration Date:   04/07/2025   Vitamin B12    Standing Status:   Future    Expiration Date:   04/07/2025   Methylmalonic acid, serum    Standing Status:   Future     Expiration Date:   07/07/2024   All questions were answered. The patient knows to call the clinic with any problems, questions or concerns.  Lowanda Ruddy, MD 4/29/20252:41 PM   CHIEF COMPLAINTS/PURPOSE OF CONSULTATION:  Anemia  HISTORY OF PRESENTING ILLNESS:  Julia Cobb 48 y.o. female is here because of anemia. Per outside records, she has ongoing vaginal bleeding from 02/22/24 to her visit on 03/16/24. Bleeding is heavy with clots and changing pad 3-4 times daily. Occasionally bleeding through the pad. Patient reports periods started to get heavier in January, then March again for most of it except for 4 days. She was started estrovent and caused palpitation. Drospirenone has not stop the bleeding.  She used to bleed for 7 days and two heavier.   She has a consultation with gyn for hysterectomy in May.  Report history of multiple fibroids.  03/16/24 ferritin 4.1 hgb 10.8 MCV 94.   Julia Cobb had not noticed any recent bleeding such as melena, hematuria or hematochezia Her last colonoscopy was fine except 2 polys and due in 2 years. No stool change or blood.  She denies any craving for ice but steak and eats a variety of diet.  The patient has been taking over a year.  MEDICAL HISTORY:  No past medical history on file.  SURGICAL HISTORY: No past surgical history on file.  SOCIAL HISTORY: Social History  Socioeconomic History   Marital status: Single    Spouse name: Not on file   Number of children: Not on file   Years of education: Not on file   Highest education level: Not on file  Occupational History   Not on file  Tobacco Use   Smoking status: Not on file   Smokeless tobacco: Not on file  Substance and Sexual Activity   Alcohol use: Not on file   Drug use: Not on file   Sexual activity: Not on file  Other Topics Concern   Not on file  Social History Narrative   Not on file   Social Drivers of Health   Financial Resource Strain: Not on file  Food  Insecurity: Not on file  Transportation Needs: Not on file  Physical Activity: Not on file  Stress: Not on file  Social Connections: Not on file  Intimate Partner Violence: Not on file    FAMILY HISTORY: Family History  Problem Relation Age of Onset   Ovarian cancer Mother 6 - 46   Breast cancer Maternal Aunt 48   Breast cancer Maternal Aunt 57    ALLERGIES:  has no allergies on file.  MEDICATIONS:  Current Outpatient Medications  Medication Sig Dispense Refill   cyanocobalamin (VITAMIN B12) 1000 MCG tablet Take 1,000 mcg by mouth daily.     Drospirenone (SLYND) 4 MG TABS Take by mouth.     FERROUS FUMARATE-VITAMIN C PO Take by mouth.     fluticasone (FLONASE) 50 MCG/ACT nasal spray Place into both nostrils daily.     ibuprofen (ADVIL) 600 MG tablet Take 600 mg by mouth.     Multiple Vitamin (MULTIVITAMIN) tablet Take 1 tablet by mouth daily.     Vitamin D, Ergocalciferol, 50 MCG (2000 UT) CAPS Take by mouth.     No current facility-administered medications for this visit.    REVIEW OF SYSTEMS:   All relevant systems were reviewed with the patient and are negative.  PHYSICAL EXAMINATION:  Vitals:   04/07/24 1412  BP: 132/70  Pulse: 76  Resp: 16  Temp: 98.5 F (36.9 C)  SpO2: 100%   Filed Weights   04/07/24 1412  Weight: 159 lb 3.2 oz (72.2 kg)    GENERAL: alert, no distress and comfortable SKIN: skin color normal EYES: pale conjunctiva, sclera clear   Labs reviewed.

## 2024-04-07 ENCOUNTER — Inpatient Hospital Stay

## 2024-04-07 ENCOUNTER — Ambulatory Visit
Admission: RE | Admit: 2024-04-07 | Discharge: 2024-04-07 | Disposition: A | Discharge status: Home / Self Care | Payer: 59 | Source: Ambulatory Visit | Attending: Internal Medicine

## 2024-04-07 VITALS — BP 132/70 | HR 76 | Temp 98.5°F | Resp 16 | Ht 62.0 in | Wt 159.2 lb

## 2024-04-07 DIAGNOSIS — N920 Excessive and frequent menstruation with regular cycle: Secondary | ICD-10-CM | POA: Diagnosis present

## 2024-04-07 DIAGNOSIS — Z86018 Personal history of other benign neoplasm: Secondary | ICD-10-CM | POA: Diagnosis not present

## 2024-04-07 DIAGNOSIS — D509 Iron deficiency anemia, unspecified: Secondary | ICD-10-CM | POA: Insufficient documentation

## 2024-04-07 DIAGNOSIS — Z1231 Encounter for screening mammogram for malignant neoplasm of breast: Secondary | ICD-10-CM

## 2024-04-07 DIAGNOSIS — D649 Anemia, unspecified: Secondary | ICD-10-CM

## 2024-04-07 DIAGNOSIS — D500 Iron deficiency anemia secondary to blood loss (chronic): Secondary | ICD-10-CM | POA: Insufficient documentation

## 2024-04-07 NOTE — Assessment & Plan Note (Signed)
  IV iron feraheme ordered. CBC, ferritin, b12 and folate in about 8 weeks Follow up with Gyn for hysterectomy Follow up with me in about 8-9 weeks, 1-2 days after labs

## 2024-04-08 ENCOUNTER — Telehealth: Payer: Self-pay

## 2024-04-08 NOTE — Telephone Encounter (Signed)
 Dr. Alita Irwin, patient will be scheduled as soon as possible.  Auth Submission: APPROVED Site of care: Site of care: [facility] Payer: [employer] commercial Medication & CPT/J Code(s) submitted: Feraheme (ferumoxytol) [id number] Route of submission (phone, fax, portal): portal Phone # Fax # Auth type: Buy/Bill PB Units/visits requested: 510mg  x 2 doses Reference number: [id number] Approval from: 04/07/24 to 07/07/24

## 2024-04-13 ENCOUNTER — Ambulatory Visit

## 2024-04-13 VITALS — BP 123/78 | HR 88 | Temp 98.6°F | Resp 14 | Ht 61.0 in | Wt 157.4 lb

## 2024-04-13 DIAGNOSIS — N920 Excessive and frequent menstruation with regular cycle: Secondary | ICD-10-CM

## 2024-04-13 DIAGNOSIS — D500 Iron deficiency anemia secondary to blood loss (chronic): Secondary | ICD-10-CM | POA: Diagnosis not present

## 2024-04-13 MED ORDER — SODIUM CHLORIDE 0.9 % IV SOLN
510.0000 mg | Freq: Once | INTRAVENOUS | Status: AC
  Administered 2024-04-13: 510 mg via INTRAVENOUS
  Filled 2024-04-13: qty 17

## 2024-04-13 NOTE — Progress Notes (Signed)
 Diagnosis: Iron Deficiency Anemia  Provider:  Praveen Mannam MD  Procedure: IV Infusion  IV Type: Peripheral, IV Location: L Forearm  Feraheme (Ferumoxytol), Dose: 510 mg  Infusion Start Time: 1530  Infusion Stop Time: 1550  Post Infusion IV Care: Observation period completed  Discharge: Condition: Good, Destination: Home . AVS Provided  Performed by:  Ruthie Berch, RN

## 2024-04-20 ENCOUNTER — Ambulatory Visit (INDEPENDENT_AMBULATORY_CARE_PROVIDER_SITE_OTHER)

## 2024-04-20 VITALS — BP 149/89 | HR 72 | Temp 98.2°F | Resp 18 | Ht 62.0 in | Wt 160.8 lb

## 2024-04-20 DIAGNOSIS — N920 Excessive and frequent menstruation with regular cycle: Secondary | ICD-10-CM

## 2024-04-20 DIAGNOSIS — D500 Iron deficiency anemia secondary to blood loss (chronic): Secondary | ICD-10-CM

## 2024-04-20 MED ORDER — SODIUM CHLORIDE 0.9 % IV SOLN
510.0000 mg | Freq: Once | INTRAVENOUS | Status: AC
  Administered 2024-04-20: 510 mg via INTRAVENOUS
  Filled 2024-04-20: qty 17

## 2024-04-20 NOTE — Progress Notes (Signed)
 Diagnosis: Iron Deficiency Anemia  Provider:  Praveen Mannam MD  Procedure: IV Infusion  IV Type: Peripheral, IV Location: R Antecubital  Feraheme (Ferumoxytol ), Dose: 510 mg  Infusion Start Time: 1555  Infusion Stop Time: 1615  Post Infusion IV Care: Observation period completed  Discharge: Condition: Good, Destination: Home . AVS Declined  Performed by:  Lauran Pollard, LPN

## 2024-06-02 ENCOUNTER — Inpatient Hospital Stay

## 2024-06-02 DIAGNOSIS — D500 Iron deficiency anemia secondary to blood loss (chronic): Secondary | ICD-10-CM | POA: Insufficient documentation

## 2024-06-02 DIAGNOSIS — N920 Excessive and frequent menstruation with regular cycle: Secondary | ICD-10-CM | POA: Diagnosis present

## 2024-06-02 DIAGNOSIS — D649 Anemia, unspecified: Secondary | ICD-10-CM

## 2024-06-02 LAB — CBC WITH DIFFERENTIAL (CANCER CENTER ONLY)
Abs Immature Granulocytes: 0.02 10*3/uL (ref 0.00–0.07)
Basophils Absolute: 0.1 10*3/uL (ref 0.0–0.1)
Basophils Relative: 1 %
Eosinophils Absolute: 0.2 10*3/uL (ref 0.0–0.5)
Eosinophils Relative: 3 %
HCT: 38.2 % (ref 36.0–46.0)
Hemoglobin: 13.1 g/dL (ref 12.0–15.0)
Immature Granulocytes: 0 %
Lymphocytes Relative: 26 %
Lymphs Abs: 1.9 10*3/uL (ref 0.7–4.0)
MCH: 32.4 pg (ref 26.0–34.0)
MCHC: 34.3 g/dL (ref 30.0–36.0)
MCV: 94.6 fL (ref 80.0–100.0)
Monocytes Absolute: 0.5 10*3/uL (ref 0.1–1.0)
Monocytes Relative: 6 %
Neutro Abs: 4.6 10*3/uL (ref 1.7–7.7)
Neutrophils Relative %: 64 %
Platelet Count: 332 10*3/uL (ref 150–400)
RBC: 4.04 MIL/uL (ref 3.87–5.11)
RDW: 14.8 % (ref 11.5–15.5)
WBC Count: 7.2 10*3/uL (ref 4.0–10.5)
nRBC: 0 % (ref 0.0–0.2)

## 2024-06-02 LAB — IRON AND IRON BINDING CAPACITY (CC-WL,HP ONLY)
Iron: 77 ug/dL (ref 28–170)
Saturation Ratios: 21 % (ref 10.4–31.8)
TIBC: 363 ug/dL (ref 250–450)
UIBC: 286 ug/dL (ref 148–442)

## 2024-06-02 LAB — VITAMIN B12: Vitamin B-12: 773 pg/mL (ref 180–914)

## 2024-06-02 LAB — FOLATE: Folate: 21.6 ng/mL (ref >5.9)

## 2024-06-02 LAB — FERRITIN: Ferritin: 223 ng/mL (ref 11–307)

## 2024-06-02 NOTE — Assessment & Plan Note (Signed)
 Repeat lab in late Oct and follow up a few days later Report colonoscopy is up to date. Will continue follow up per GI.

## 2024-06-02 NOTE — Progress Notes (Unsigned)
 [facility] OFFICE PROGRESS NOTE  Patient Care Team: Signa Dire, MD (Inactive) as PCP - General (Family Medicine)  Julia Cobb is a 48 y.o. female with history of abnormal uterine bleeding, fibroids, iron deficiency being seen for iron deficiency anemia.   Pre-infusion ferritin was 4. Hgb 10.8. now hgb is 13. Assessment & Plan   No orders of the defined types were placed in this encounter.    Pauletta JAYSON Chihuahua, MD  INTERVAL HISTORY: Patient returns for follow-up.  Hematology History: Per outside records, she has ongoing vaginal bleeding from 02/22/24 to her visit on 03/16/24. Bleeding is heavy with clots and changing pad 3-4 times daily. Occasionally bleeding through the pad. Patient reports periods started to get heavier in January, then March again for most of it except for 4 days. She was started estrovent and caused palpitation. Drospirenone has not stop the bleeding.  She used to bleed for 7 days and two heavier.    She has a consultation with gyn for hysterectomy in May.   Report history of multiple fibroids.   03/16/24 ferritin 4.1 hgb 10.8 MCV 94.    Daphney had not noticed any recent bleeding such as melena, hematuria or hematochezia Her last colonoscopy was fine except 2 polys and due in 2 years. No stool change or blood.   She denies any craving for ice but steak and eats a variety of diet.     PHYSICAL EXAMINATION:  There were no vitals filed for this visit. There were no vitals filed for this visit.  Relevant data reviewed during this visit included ***

## 2024-06-04 ENCOUNTER — Inpatient Hospital Stay (HOSPITAL_BASED_OUTPATIENT_CLINIC_OR_DEPARTMENT_OTHER)

## 2024-06-04 VITALS — BP 149/89 | HR 76 | Temp 97.2°F | Resp 20 | Wt 162.9 lb

## 2024-06-04 DIAGNOSIS — D500 Iron deficiency anemia secondary to blood loss (chronic): Secondary | ICD-10-CM

## 2024-06-05 ENCOUNTER — Ambulatory Visit: Payer: Self-pay

## 2024-06-05 LAB — METHYLMALONIC ACID, SERUM: Methylmalonic Acid, Quantitative: 91 nmol/L (ref 0–378)

## 2024-06-05 NOTE — Telephone Encounter (Signed)
-----   Message from Pauletta JAYSON Chihuahua sent at 06/05/2024  8:39 AM EDT ----- Luke please let her know rest of labs were normal. No concerns. Follow up as planned in Oct. Thanks  ----- Message ----- From: Rebecka, Lab In [place] Sent: 06/02/2024   7:49 AM EDT To: Pauletta JAYSON Chihuahua, MD

## 2024-06-05 NOTE — Telephone Encounter (Signed)
 Left message advising patient labs are normal and are available via mychart.  Follow up in October.  Advised to call if have any questions.

## 2024-06-16 ENCOUNTER — Encounter (HOSPITAL_COMMUNITY): Payer: Self-pay | Admitting: Obstetrics and Gynecology

## 2024-06-16 NOTE — Progress Notes (Signed)
 Spoke w/ via phone for pre-op interview--- pt Lab needs dos----   upt (per anes)      Lab results------ lab appt 06-22-2024 @ 1300 getting CBC/ T&S (per anes) COVID test -----patient states asymptomatic no test needed Arrive at ------- 0530 on 06-23-2024 NPO after MN w/ exception sips of water w/ meds Pre-Surgery Ensure or G2: n/a  Med rec completed Medications to take morning of surgery ----- flonase spray Diabetic medication ----- n/a  GLP1 agonist last dose: n/a GLP1 instructions:  Patient instructed no nail polish to be worn day of surgery Patient instructed to bring photo id and insurance card day of surgery Patient aware to have Driver (ride ) / caregiver    for 24 hours after surgery - husband, demeterius Dudziak Patient Special Instructions ----- will pick up bag w/ soap and written instructions at lab appt Pre-Op special Instructions -----  sent inbox message to Dr Rosalva on 06-04-2024 and 06-16-2024, requested orders.  Patient verbalized understanding of instructions that were given at this phone interview. Patient denies chest pain, sob, fever, cough at the interview.

## 2024-06-16 NOTE — Pre-Procedure Instructions (Signed)
 Surgical Instructions   Your procedure is scheduled on :  Tuesday,  06-23-2024 . Report to [facility] Main Entrance A at 5:30  A.M., then check in with the Admitting office. Any questions or running late day of surgery: call [phone]  Questions prior to your surgery date: call [phone], Monday-Friday, 8am-4pm. If you experience any cold or flu symptoms such as cough, fever, chills, shortness of breath, etc. between now and your scheduled surgery, please notify your surgeon office.    Remember:  Do not eat any food and do not drink any liquids after midnight the night before your surgery.  This includes no water,  candy,  gum,  and  mints.    Take these medicines the morning of surgery with A SIP OF WATER :  none   May take these medicines IF NEEDED: sips of water Fluticasone (flonase) nasal spray    One week prior to surgery, STOP taking any Aspirin (unless otherwise instructed by your surgeon) Aleve, Naproxen, Ibuprofen, Motrin, Advil, Goody's, BC's, all herbal medications, fish oil, and non-prescription vitamins.                     Do NOT Smoke (Tobacco/Vaping) and Do Not drink alcohol for 24 hours prior to your procedure.  If you use a CPAP at night, you may bring your mask/headgear for your overnight stay.   You will be asked to remove any contacts, glasses, piercing's, hearing aid's, dentures/partials prior to surgery. Please bring cases for these items if needed.    Patients discharged the day of surgery will not be allowed to drive home, and someone needs to stay with them for 24 hours.  SURGICAL WAITING ROOM VISITATION Patients may have no more than 2 support people in the waiting area - these visitors may rotate.   Pre-op nurse will coordinate an appropriate time for 1 ADULT support person, who may not rotate, to accompany patient in pre-op.  Children under the age of 84 must have an adult with them who is not the patient and must remain in the main waiting  area with an adult.  If the patient needs to stay at the hospital during part of their recovery, the visitor guidelines for inpatient rooms apply.  Please refer to the [facility] website for the visitor guidelines for any additional information.   If you received a COVID test during your pre-op visit  it is requested that you wear a mask when out in public, stay away from anyone that may not be feeling well and notify your surgeon if you develop symptoms. If you have been in contact with anyone that has tested positive in the last 10 days please notify you surgeon.      Pre-operative CHG Bathing Instructions   You can play a key role in reducing the risk of infection after surgery. Your skin needs to be as free of germs as possible. You can reduce the number of germs on your skin by washing with CHG (chlorhexidine gluconate) soap before surgery. CHG is an antiseptic soap that kills germs and continues to kill germs even after washing.   DO NOT use if you have an allergy to chlorhexidine/CHG or antibacterial soaps. If your skin becomes reddened or irritated, stop using the CHG and notify Pre-op nurse day of surgery.             TAKE A SHOWER THE NIGHT BEFORE SURGERY AND THE DAY OF SURGERY    Please keep  in mind the following:  DO NOT shave, including legs and underarms, 48 hours prior to surgery.   You may shave your face before/day of surgery.  Place clean sheets on your bed the night before surgery Use a clean washcloth (not used since being washed) for each shower. DO NOT sleep with pet's night before surgery.  CHG Shower Instructions:  Wash your face and private area with normal soap. If you choose to wash your hair, wash first with your normal shampoo.  After you use shampoo/soap, rinse your hair and body thoroughly to remove shampoo/soap residue.  Turn the water OFF and apply half the bottle of CHG soap to a CLEAN washcloth.  Apply CHG soap ONLY FROM YOUR NECK DOWN TO YOUR TOES  (washing for 3-5 minutes)  DO NOT use CHG soap on face, private areas, open wounds, or sores.  Pay special attention to the area where your surgery is being performed.  If you are having back surgery, having someone wash your back for you may be helpful. Wait 2 minutes after CHG soap is applied, then you may rinse off the CHG soap.  Pat dry with a clean towel  Put on clean pajamas    Additional instructions for the day of surgery: DO NOT APPLY any lotions,  powder,  oils,  deodorants (may use underarm deodorant) , cologne/  perfumes or makeup Do not wear jewelry /  piercing's/ metal/  permanent jewelry must be removed prior to arrival day of surgery.  (No plastic piercing) Do not wear nail polish, gel polish, artificial nails, or any other type of covering on natural finger nails (toe nails are okay) Do not bring valuables to the hospital. [facility] is not responsible for valuables/personal belongings. Put on clean/comfortable clothes.  Please brush your teeth.  Ask your nurse before applying any prescription medications to the skin.

## 2024-06-21 ENCOUNTER — Other Ambulatory Visit: Payer: Self-pay | Admitting: Obstetrics and Gynecology

## 2024-06-21 DIAGNOSIS — N939 Abnormal uterine and vaginal bleeding, unspecified: Secondary | ICD-10-CM

## 2024-06-21 NOTE — H&P (Signed)
 eason for Appointment   1.  Preop visit/ Abnormal Uterine Bleeding/ Fibroids/ Anemia / Ovarian cyst History of Present Illness    General:  48 y/o presents for preoperative visit. Pt is schedule for a  robotic assisted laparoscopic hysterectomy wtih bilateral salpingo-oophorectomy on 06/23/2024 for the management of AUB, fibroids, left overian cyst, and chronic blood loss anemia.    IN REVIEW:  She was on MPA however she had heart palpatations and this was discontiued. She is now on sylnd. She bled 25 days out of 31 in March. In April she bled 21 days out of 30 days. ( she was on provera and it stopped for a while but once she discontinued the provera the bleeding restarted) she is now on sylnd.   On her heaviest day she was changing a thick pad every 2 hours on her heaviest day. It is affecting her quality of life. she is unable to focus at work.     She is anemic. she has her first iron infusion scheduled for on 04/13/2024 at 330 pm.  --- TVUS on 03/26/2024 notable for uterus measuring 13.38 x 9.23 x 9.26 cm.  Six uterine fibroids increased in size: 1) Fundal calcified 4.6 cm 2) Anterior 4.2 cm 3) Posterior calcified 3.8 cm 4) Anterior submucosal 3 cm 5) Anterior 3 cm 6) Posterior 3 cm Endometrium 1.54 cm, irregular in appearance due to fibroids.  Right ovary normal.  Left ovary cyst with thin septation 4.6 cm, avascular. 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 The patient verbally consented to use of the [employer] application for their encounter today. All questions were answered. If the patient declined consent they understand there is no consequence for this informed decision.  Current Medications Taking Cyanocobalamin  1000 MCG Tablet 1 tablet Orally Once a day Gallifrey(Norethindrone Acetate) 5 MG Tablet TAKE 1 TABLET BY MOUTH TWICE DAILY Hair Skin & Nails Gummies(Biotin w/ Vitamins C & E) 1250-7.5-3.375 MCG-MG-MG  Tablet Chewable as directed Orally Flonase(Fluticasone Propionate) 50 MCG/ACT Suspension 1 spray in each nostril Nasally as needed Multivitamin(Multiple Vitamin) - Tablet 1 tablet Orally Once a day Vitamin D (Cholecalciferol) 50 MCG (2000 UT) Capsule 1 capsule Orally Once a day Feosol(Ferrous Sulfate Dried) 200 (65 Fe) MG Tablet 1 tablet Orally Once a day Not-Taking Midol  200 MG Capsule 1 capsule with food or milk as needed Orally Three times a day Unknown Ferrous Fumarate-Vitamin C , Notes to Pharmacist: iron 20mg , Vitamin C 26mg  Ibuprofen  200 MG Tablet 2 tablets with food or milk as needed Orally Three times a day As needed Medication List reviewed and reconciled with the patient Past Medical History Iron deficiency anemia. Uterine fibroids. HLD. Seasonal allergies. Menorrhagia due to uterine fibroids. Ovarian cyst-sees OB/GYN. Surgical History laser cervix - dysplasia - age 50 Colonoscopy 10/2021 Endometrial biopsy-benign 05/2022 Family History Father: deceased, depression, suicide Mother: deceased, HIV, ovarian cancer in her 85s, diagnosed with Ovarian cancer Paternal Grand Father: deceased Paternal Grand Mother: deceased, diagnosed with Colon cancer Maternal Grand Father: deceased Maternal Grand Mother: deceased, CABG Brother 1: alive Brother2: alive Paternal uncle: deceased, diagnosed with Colon cancer Paternal aunt: alive, diagnosed with Colon cancer Maternal uncle: alive, GI ca, Colon CA, diagnosed with Colon cancer Maternal aunt: breast CA, diverticulitis, diagnosed with Breast cancer, Ovarian cancer 2 brother(s) . 1 son(s) , 1 daughter(s) . No Family History of Polyps or Liver Disease Unsure-aunts and uncles with colon cancer. no known hx of uterine cancers. Social  History    General:  Tobacco use cigarettes: Never smoked, Tobacco history last updated 06/09/2024, Vaping No. EXPOSURE TO PASSIVE SMOKE: no. Alcohol: yes, 3 times per week. Caffeine: yes, coffee, 1 serving  daily. Recreational drug use: no. Exercise: yes. Marital Status: married. Children: Boys, 1, girls, 1. EDUCATION: College. OCCUPATION: employed, Airline pilot, [employer],. Seat belt use: yes. Gyn History Sexual activity not currently sexually active.  Periods : irregular.  LMP 03/26/2024- 04/13/2024, spotting on 06/01/2024-06/05/2024.  Birth control vasectomy- husband.  Last pap smear date 02/2020- NILM.  Last mammogram date 04/07/2024- neg.  H/O Abnormal pap smear age 69, treated with cryo.  Denies H/O STD.  Menarche 9/10.  OB History Number of pregnancies  2.  Pregnancy # 1  live birth, vaginal delivery, boy.  Pregnancy # 2  live birth, vaginal delivery, girl.  Allergies medroxyPROGESTERone Acetate: palpitations, shortness of breath - Allergy Hospitalization/Major Diagnostic Procedure child birth x 2 kidney stone Review of Systems    CONSTITUTIONAL:  Chills No. Fatigue No. Fever No. Night sweats No. Recent travel outside [place]  No. Sweats No. Weight change No.     OPHTHALMOLOGY:  Blurring of vision no. Change in vision no. Double vision no.     ENT:  Dizziness no. Nose bleeds no. Sore throat no. Teeth pain no.     ALLERGY:  Hives no.     CARDIOLOGY:  Chest pain no. High blood pressure no. Irregular heart beat no. Leg edema no. Palpitations no.     RESPIRATORY:  Shortness of breath no. Cough no. Wheezing no.     UROLOGY:  Pain with urination no. Urinary urgency no. Urinary frequency no. Urinary incontinence no. Difficulty urinating No. Blood in urine No.     GASTROENTEROLOGY:  Abdominal pain no. Appetite change no. Bloating/belching no. Blood in stool or on toilet paper no. Change in bowel movements no. Constipation no. Diarrhea no. Difficulty swallowing no. Nausea no.     FEMALE REPRODUCTIVE:  Vulvar pain no. Vulvar rash no. Abnormal vaginal bleeding , heavy menses. Breast pain no. Nipple discharge no. Pain with intercourse no. Pelvic pain no. Unusual vaginal discharge no. Vaginal itching no.      MUSCULOSKELETAL:  Muscle aches no.     NEUROLOGY:  Headache no. Tingling/numbness no. Weakness no.     PSYCHOLOGY:  Depression no. Anxiety no. Nervousness no. Sleep disturbances no. Suicidal ideation no .     ENDOCRINOLOGY:  Excessive thirst no. Excessive urination no. Hair loss no. Heat or cold intolerance no.     HEMATOLOGY/LYMPH:  Abnormal bleeding no. Easy bruising no. Swollen glands no.     DERMATOLOGY:  New/changing skin lesion no. Rash no. Sores no.  Vital Signs Wt: 165.0, Wt change: 7.4 lbs, Ht: 61.25, BMI: 30.92, Pulse sitting: 87, BP sitting: 155/94. Initial BP: 161/96. Examination    General Examination: CONSTITUTIONAL: alert, oriented, NAD.  SKIN:  moist, warm.  EYES: Conjunctiva clear.  LUNGS: good I:E efffort noted , clear to auscultation bilaterally.  HEART: regular rate and rhythm.  ABDOMEN: soft, non-tender/non-distended, bowel sounds present.  FEMALE GENITOURINARY: normal external genitalia, labia - unremarkable, vagina - pink moist mucosa, no lesions or abnormal discharge, cervix - no discharge or lesions or CMT, adnexa - no masses or tenderness, uterus -18 week size tender to palpation.  EXTREMITIES: no edema present.  PSYCH:  affect normal, good eye contact.  Physical Examination    Chaperone present:  Chaperone present Plata, Menda 06/09/2024 09:09:25 AM >, for pelvic exam.  Assessments 1. Abnormal uterine  bleeding (AUB) - N93.9 (Primary)    2. Fibroids - D21.9    3. Chronic blood loss anemia - D50.0    4. Left ovarian cyst - N83.202    Treatment 1. Abnormal uterine bleeding (AUB)        Notes: Planning robotic-assisted laparoscopic hysterectomy with bilateral salpingo-oophoectomy Pt advised she may stay overnight, or 2 days if conversion to larger incision. Discussed risks of hysterectomy including but not limited to infection, bleeding, conversion to larger incision, damage to her bowel, bladder, or ureters, with the need for further surgery. Discussed  risk of blood transfusion and risk of HIV or hep B&C ( with blood transfusion. Pt is aware of risks and desires blood transfusion if needed. Pt advised to avoid NSAIDs (Aspirin, Aleve, Advil , Ibuprofen , Motrin ) from now until surgery given risk of bleeding during surgery. She may take Tylenol  for pain management. She is advised to avoid eating or drinking starting midnight prior to surgery. Discussed post-surgery avoidance of driving for 1 week and avoidance of lifting weight greater than 10 lbs or intercourse for 6-8 weeks after procedure.    2. Fibroids        Notes: Planning robotic-assisted laparoscopic hysterectomy with bilateral salpingo-oophoectomy Pt advised she may stay overnight, or 2 days if conversion to larger incision. Discussed risks of hysterectomy including but not limited to infection, bleeding, conversion to larger incision, damage to her bowel, bladder, or ureters, with the need for further surgery. Discussed risk of blood transfusion and risk of HIV or hep B&C ( with blood transfusion. Pt is aware of risks and desires blood transfusion if needed. Pt advised to avoid NSAIDs (Aspirin, Aleve, Advil , Ibuprofen , Motrin ) from now until surgery given risk of bleeding during surgery. She may take Tylenol  for pain management. She is advised to avoid eating or drinking starting midnight prior to surgery. Discussed post-surgery avoidance of driving for 1 week and avoidance of lifting weight greater than 10 lbs or intercourse for 6-8 weeks after procedure.    3. Chronic blood loss anemia        Notes: Planning robotic-assisted laparoscopic hysterectomy with bilateral salpingo-oophoectomy Pt advised she may stay overnight, or 2 days if conversion to larger incision. Discussed risks of hysterectomy including but not limited to infection, bleeding, conversion to larger incision, damage to her bowel, bladder, or ureters, with the need for further surgery. Discussed risk of blood transfusion and risk of  HIV or hep B&C ( with blood transfusion. Pt is aware of risks and desires blood transfusion if needed. Pt advised to avoid NSAIDs (Aspirin, Aleve, Advil , Ibuprofen , Motrin ) from now until surgery given risk of bleeding during surgery. She may take Tylenol  for pain management. She is advised to avoid eating or drinking starting midnight prior to surgery. Discussed post-surgery avoidance of driving for 1 week and avoidance of lifting weight greater than 10 lbs or intercourse for 6-8 weeks after procedure.    4. Left ovarian cyst        Notes: Planning robotic-assisted laparoscopic hysterectomy with bilateral salpingo-oophoectomy Pt advised she may stay overnight, or 2 days if conversion to larger incision. Discussed risks of hysterectomy including but not limited to infection, bleeding, conversion to larger incision, damage to her bowel, bladder, or ureters, with the need for further surgery. Discussed risk of blood transfusion and risk of HIV or hep B&C ( with blood transfusion. Pt is aware of risks and desires blood transfusion if needed. Pt advised to avoid NSAIDs (Aspirin, Aleve, Advil , Ibuprofen , Motrin )  from now until surgery given risk of bleeding during surgery. She may take Tylenol  for pain management. She is advised to avoid eating or drinking starting midnight prior to surgery. Discussed post-surgery avoidance of driving for 1 week and avoidance of lifting weight greater than 10 lbs or intercourse for 6-8 weeks after procedure.

## 2024-06-22 ENCOUNTER — Encounter (HOSPITAL_COMMUNITY)
Admission: RE | Admit: 2024-06-22 | Discharge: 2024-06-22 | Disposition: A | Discharge status: Home / Self Care | Source: Ambulatory Visit | Attending: Obstetrics and Gynecology

## 2024-06-22 ENCOUNTER — Other Ambulatory Visit: Payer: Self-pay | Admitting: Obstetrics and Gynecology

## 2024-06-22 DIAGNOSIS — N939 Abnormal uterine and vaginal bleeding, unspecified: Secondary | ICD-10-CM | POA: Diagnosis not present

## 2024-06-22 DIAGNOSIS — Z01812 Encounter for preprocedural laboratory examination: Secondary | ICD-10-CM | POA: Diagnosis present

## 2024-06-22 LAB — TYPE AND SCREEN
ABO/RH(D): B POS
Antibody Screen: NEGATIVE

## 2024-06-22 LAB — CBC
HCT: 40.5 % (ref 36.0–46.0)
Hemoglobin: 13.6 g/dL (ref 12.0–15.0)
MCH: 32.1 pg (ref 26.0–34.0)
MCHC: 33.6 g/dL (ref 30.0–36.0)
MCV: 95.5 fL (ref 80.0–100.0)
Platelets: 321 K/uL (ref 150–400)
RBC: 4.24 MIL/uL (ref 3.87–5.11)
RDW: 14.9 % (ref 11.5–15.5)
WBC: 9.4 K/uL (ref 4.0–10.5)
nRBC: 0 % (ref 0.0–0.2)

## 2024-06-22 NOTE — H&P (Signed)
 eason for Appointment   1.  Preop visit/ Abnormal Uterine Bleeding/ Fibroids/ Anemia / Ovarian cyst History of Present Illness    General:  48 y/o presents for preoperative visit. Pt is schedule for a  robotic assisted laparoscopic hysterectomy wtih bilateral salpingo-oophorectomy on 06/23/2024 for the management of AUB, fibroids, left overian cyst, and chronic blood loss anemia.    IN REVIEW:  She was on MPA however she had heart palpatations and this was discontiued. She is now on sylnd. She bled 25 days out of 31 in March. In April she bled 21 days out of 30 days. ( she was on provera and it stopped for a while but once she discontinued the provera the bleeding restarted) she is now on sylnd.   On her heaviest day she was changing a thick pad every 2 hours on her heaviest day. It is affecting her quality of life. she is unable to focus at work.     She is anemic. she has her first iron infusion scheduled for on 04/13/2024 at 330 pm.  --- TVUS on 03/26/2024 notable for uterus measuring 13.38 x 9.23 x 9.26 cm.  Six uterine fibroids increased in size: 1) Fundal calcified 4.6 cm 2) Anterior 4.2 cm 3) Posterior calcified 3.8 cm 4) Anterior submucosal 3 cm 5) Anterior 3 cm 6) Posterior 3 cm Endometrium 1.54 cm, irregular in appearance due to fibroids.  Right ovary normal.  Left ovary cyst with thin septation 4.6 cm, avascular. 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 The patient verbally consented to use of the [employer] application for their encounter today. All questions were answered. If the patient declined consent they understand there is no consequence for this informed decision.  Current Medications Taking Cyanocobalamin  1000 MCG Tablet 1 tablet Orally Once a day Gallifrey(Norethindrone Acetate) 5 MG Tablet TAKE 1 TABLET BY MOUTH TWICE DAILY Hair Skin & Nails Gummies(Biotin w/ Vitamins C & E) 1250-7.5-3.375 MCG-MG-MG  Tablet Chewable as directed Orally Flonase(Fluticasone Propionate) 50 MCG/ACT Suspension 1 spray in each nostril Nasally as needed Multivitamin(Multiple Vitamin) - Tablet 1 tablet Orally Once a day Vitamin D (Cholecalciferol) 50 MCG (2000 UT) Capsule 1 capsule Orally Once a day Feosol(Ferrous Sulfate Dried) 200 (65 Fe) MG Tablet 1 tablet Orally Once a day Not-Taking Midol  200 MG Capsule 1 capsule with food or milk as needed Orally Three times a day Unknown Ferrous Fumarate-Vitamin C , Notes to Pharmacist: iron 20mg , Vitamin C 26mg  Ibuprofen  200 MG Tablet 2 tablets with food or milk as needed Orally Three times a day As needed Medication List reviewed and reconciled with the patient Past Medical History Iron deficiency anemia. Uterine fibroids. HLD. Seasonal allergies. Menorrhagia due to uterine fibroids. Ovarian cyst-sees OB/GYN. Surgical History laser cervix - dysplasia - age 50 Colonoscopy 10/2021 Endometrial biopsy-benign 05/2022 Family History Father: deceased, depression, suicide Mother: deceased, HIV, ovarian cancer in her 85s, diagnosed with Ovarian cancer Paternal Grand Father: deceased Paternal Grand Mother: deceased, diagnosed with Colon cancer Maternal Grand Father: deceased Maternal Grand Mother: deceased, CABG Brother 1: alive Brother2: alive Paternal uncle: deceased, diagnosed with Colon cancer Paternal aunt: alive, diagnosed with Colon cancer Maternal uncle: alive, GI ca, Colon CA, diagnosed with Colon cancer Maternal aunt: breast CA, diverticulitis, diagnosed with Breast cancer, Ovarian cancer 2 brother(s) . 1 son(s) , 1 daughter(s) . No Family History of Polyps or Liver Disease Unsure-aunts and uncles with colon cancer. no known hx of uterine cancers. Social  History    General:  Tobacco use cigarettes: Never smoked, Tobacco history last updated 06/09/2024, Vaping No. EXPOSURE TO PASSIVE SMOKE: no. Alcohol: yes, 3 times per week. Caffeine: yes, coffee, 1 serving  daily. Recreational drug use: no. Exercise: yes. Marital Status: married. Children: Boys, 1, girls, 1. EDUCATION: College. OCCUPATION: employed, Airline pilot, [employer],. Seat belt use: yes. Gyn History Sexual activity not currently sexually active.  Periods : irregular.  LMP 03/26/2024- 04/13/2024, spotting on 06/01/2024-06/05/2024.  Birth control vasectomy- husband.  Last pap smear date 02/2020- NILM.  Last mammogram date 04/07/2024- neg.  H/O Abnormal pap smear age 69, treated with cryo.  Denies H/O STD.  Menarche 9/10.  OB History Number of pregnancies  2.  Pregnancy # 1  live birth, vaginal delivery, boy.  Pregnancy # 2  live birth, vaginal delivery, girl.  Allergies medroxyPROGESTERone Acetate: palpitations, shortness of breath - Allergy Hospitalization/Major Diagnostic Procedure child birth x 2 kidney stone Review of Systems    CONSTITUTIONAL:  Chills No. Fatigue No. Fever No. Night sweats No. Recent travel outside [place]  No. Sweats No. Weight change No.     OPHTHALMOLOGY:  Blurring of vision no. Change in vision no. Double vision no.     ENT:  Dizziness no. Nose bleeds no. Sore throat no. Teeth pain no.     ALLERGY:  Hives no.     CARDIOLOGY:  Chest pain no. High blood pressure no. Irregular heart beat no. Leg edema no. Palpitations no.     RESPIRATORY:  Shortness of breath no. Cough no. Wheezing no.     UROLOGY:  Pain with urination no. Urinary urgency no. Urinary frequency no. Urinary incontinence no. Difficulty urinating No. Blood in urine No.     GASTROENTEROLOGY:  Abdominal pain no. Appetite change no. Bloating/belching no. Blood in stool or on toilet paper no. Change in bowel movements no. Constipation no. Diarrhea no. Difficulty swallowing no. Nausea no.     FEMALE REPRODUCTIVE:  Vulvar pain no. Vulvar rash no. Abnormal vaginal bleeding , heavy menses. Breast pain no. Nipple discharge no. Pain with intercourse no. Pelvic pain no. Unusual vaginal discharge no. Vaginal itching no.      MUSCULOSKELETAL:  Muscle aches no.     NEUROLOGY:  Headache no. Tingling/numbness no. Weakness no.     PSYCHOLOGY:  Depression no. Anxiety no. Nervousness no. Sleep disturbances no. Suicidal ideation no .     ENDOCRINOLOGY:  Excessive thirst no. Excessive urination no. Hair loss no. Heat or cold intolerance no.     HEMATOLOGY/LYMPH:  Abnormal bleeding no. Easy bruising no. Swollen glands no.     DERMATOLOGY:  New/changing skin lesion no. Rash no. Sores no.  Vital Signs Wt: 165.0, Wt change: 7.4 lbs, Ht: 61.25, BMI: 30.92, Pulse sitting: 87, BP sitting: 155/94. Initial BP: 161/96. Examination    General Examination: CONSTITUTIONAL: alert, oriented, NAD.  SKIN:  moist, warm.  EYES: Conjunctiva clear.  LUNGS: good I:E efffort noted , clear to auscultation bilaterally.  HEART: regular rate and rhythm.  ABDOMEN: soft, non-tender/non-distended, bowel sounds present.  FEMALE GENITOURINARY: normal external genitalia, labia - unremarkable, vagina - pink moist mucosa, no lesions or abnormal discharge, cervix - no discharge or lesions or CMT, adnexa - no masses or tenderness, uterus -18 week size tender to palpation.  EXTREMITIES: no edema present.  PSYCH:  affect normal, good eye contact.  Physical Examination    Chaperone present:  Chaperone present Plata, Menda 06/09/2024 09:09:25 AM >, for pelvic exam.  Assessments 1. Abnormal uterine  bleeding (AUB) - N93.9 (Primary)    2. Fibroids - D21.9    3. Chronic blood loss anemia - D50.0    4. Left ovarian cyst - N83.202    Treatment 1. Abnormal uterine bleeding (AUB)        Notes: Planning robotic-assisted laparoscopic hysterectomy with bilateral salpingo-oophoectomy Pt advised she may stay overnight, or 2 days if conversion to larger incision. Discussed risks of hysterectomy including but not limited to infection, bleeding, conversion to larger incision, damage to her bowel, bladder, or ureters, with the need for further surgery. Discussed  risk of blood transfusion and risk of HIV or hep B&C ( with blood transfusion. Pt is aware of risks and desires blood transfusion if needed. Pt advised to avoid NSAIDs (Aspirin, Aleve, Advil , Ibuprofen , Motrin ) from now until surgery given risk of bleeding during surgery. She may take Tylenol  for pain management. She is advised to avoid eating or drinking starting midnight prior to surgery. Discussed post-surgery avoidance of driving for 1 week and avoidance of lifting weight greater than 10 lbs or intercourse for 6-8 weeks after procedure.    2. Fibroids        Notes: Planning robotic-assisted laparoscopic hysterectomy with bilateral salpingo-oophoectomy Pt advised she may stay overnight, or 2 days if conversion to larger incision. Discussed risks of hysterectomy including but not limited to infection, bleeding, conversion to larger incision, damage to her bowel, bladder, or ureters, with the need for further surgery. Discussed risk of blood transfusion and risk of HIV or hep B&C ( with blood transfusion. Pt is aware of risks and desires blood transfusion if needed. Pt advised to avoid NSAIDs (Aspirin, Aleve, Advil , Ibuprofen , Motrin ) from now until surgery given risk of bleeding during surgery. She may take Tylenol  for pain management. She is advised to avoid eating or drinking starting midnight prior to surgery. Discussed post-surgery avoidance of driving for 1 week and avoidance of lifting weight greater than 10 lbs or intercourse for 6-8 weeks after procedure.    3. Chronic blood loss anemia        Notes: Planning robotic-assisted laparoscopic hysterectomy with bilateral salpingo-oophoectomy Pt advised she may stay overnight, or 2 days if conversion to larger incision. Discussed risks of hysterectomy including but not limited to infection, bleeding, conversion to larger incision, damage to her bowel, bladder, or ureters, with the need for further surgery. Discussed risk of blood transfusion and risk of  HIV or hep B&C ( with blood transfusion. Pt is aware of risks and desires blood transfusion if needed. Pt advised to avoid NSAIDs (Aspirin, Aleve, Advil , Ibuprofen , Motrin ) from now until surgery given risk of bleeding during surgery. She may take Tylenol  for pain management. She is advised to avoid eating or drinking starting midnight prior to surgery. Discussed post-surgery avoidance of driving for 1 week and avoidance of lifting weight greater than 10 lbs or intercourse for 6-8 weeks after procedure.    4. Left ovarian cyst        Notes: Planning robotic-assisted laparoscopic hysterectomy with bilateral salpingo-oophoectomy Pt advised she may stay overnight, or 2 days if conversion to larger incision. Discussed risks of hysterectomy including but not limited to infection, bleeding, conversion to larger incision, damage to her bowel, bladder, or ureters, with the need for further surgery. Discussed risk of blood transfusion and risk of HIV or hep B&C ( with blood transfusion. Pt is aware of risks and desires blood transfusion if needed. Pt advised to avoid NSAIDs (Aspirin, Aleve, Advil , Ibuprofen , Motrin )  from now until surgery given risk of bleeding during surgery. She may take Tylenol  for pain management. She is advised to avoid eating or drinking starting midnight prior to surgery. Discussed post-surgery avoidance of driving for 1 week and avoidance of lifting weight greater than 10 lbs or intercourse for 6-8 weeks after procedure.

## 2024-06-22 NOTE — H&P (Signed)
   The note originally documented on this encounter has been moved the the encounter in which it belongs.

## 2024-06-22 NOTE — Anesthesia Preprocedure Evaluation (Signed)
 Anesthesia Evaluation  Patient identified by MRN, date of birth, ID band Patient awake    Reviewed: Allergy & Precautions, H&P , NPO status , Patient's Chart, lab work & pertinent test results  Airway Mallampati: I  TM Distance: >3 FB Neck ROM: Full    Dental no notable dental hx. (+) Teeth Intact, Caps, Dental Advisory Given   Pulmonary neg pulmonary ROS   Pulmonary exam normal breath sounds clear to auscultation       Cardiovascular Exercise Tolerance: Good negative cardio ROS Normal cardiovascular exam+ Valvular Problems/Murmurs  Rhythm:Regular Rate:Normal   ECHO 05-18-2024  ef 58% w/ aortic sclerosis   Neuro/Psych negative neurological ROS  negative psych ROS   GI/Hepatic negative GI ROS, Neg liver ROS,,,  Endo/Other  negative endocrine ROS    Renal/GU negative Renal ROS  negative genitourinary   Musculoskeletal negative musculoskeletal ROS (+)    Abdominal   Peds negative pediatric ROS (+)  Hematology negative hematology ROS (+) Blood dyscrasia, anemia   Anesthesia Other Findings   Reproductive/Obstetrics negative OB ROS                              Anesthesia Physical Anesthesia Plan  ASA: 2  Anesthesia Plan: General   Post-op Pain Management: Tylenol  PO (pre-op)*, Celebrex  PO (pre-op)* and Gabapentin  PO (pre-op)*   Induction: Intravenous  PONV Risk Score and Plan: 3 and Ondansetron , Dexamethasone  and Midazolam   Airway Management Planned: Oral ETT  Additional Equipment: None  Intra-op Plan:   Post-operative Plan: Extubation in OR  Informed Consent: I have reviewed the patients History and Physical, chart, labs and discussed the procedure including the risks, benefits and alternatives for the proposed anesthesia with the patient or authorized representative who has indicated his/her understanding and acceptance.       Plan Discussed with:  Anesthesiologist  Anesthesia Plan Comments: (  )         Anesthesia Quick Evaluation

## 2024-06-23 ENCOUNTER — Encounter (HOSPITAL_COMMUNITY): Payer: Self-pay | Admitting: Obstetrics and Gynecology

## 2024-06-23 ENCOUNTER — Encounter (HOSPITAL_COMMUNITY)
Admission: RE | Disposition: A | Discharge status: Home / Self Care | Payer: Self-pay | Source: Home / Self Care | Attending: Obstetrics and Gynecology

## 2024-06-23 ENCOUNTER — Observation Stay (HOSPITAL_COMMUNITY)
Admission: RE | Admit: 2024-06-23 | Discharge: 2024-06-23 | Disposition: A | Discharge status: Home / Self Care | Attending: Obstetrics and Gynecology | Admitting: Obstetrics and Gynecology

## 2024-06-23 ENCOUNTER — Observation Stay (HOSPITAL_COMMUNITY): Admitting: Anesthesiology

## 2024-06-23 ENCOUNTER — Other Ambulatory Visit: Payer: Self-pay

## 2024-06-23 ENCOUNTER — Other Ambulatory Visit (HOSPITAL_COMMUNITY): Payer: Self-pay

## 2024-06-23 ENCOUNTER — Observation Stay (HOSPITAL_BASED_OUTPATIENT_CLINIC_OR_DEPARTMENT_OTHER): Admitting: Anesthesiology

## 2024-06-23 DIAGNOSIS — D259 Leiomyoma of uterus, unspecified: Secondary | ICD-10-CM

## 2024-06-23 DIAGNOSIS — D500 Iron deficiency anemia secondary to blood loss (chronic): Secondary | ICD-10-CM | POA: Insufficient documentation

## 2024-06-23 DIAGNOSIS — N83292 Other ovarian cyst, left side: Secondary | ICD-10-CM | POA: Diagnosis not present

## 2024-06-23 DIAGNOSIS — N83202 Unspecified ovarian cyst, left side: Secondary | ICD-10-CM | POA: Diagnosis not present

## 2024-06-23 DIAGNOSIS — N939 Abnormal uterine and vaginal bleeding, unspecified: Secondary | ICD-10-CM | POA: Insufficient documentation

## 2024-06-23 DIAGNOSIS — Z01818 Encounter for other preprocedural examination: Principal | ICD-10-CM

## 2024-06-23 HISTORY — DX: Abnormal uterine and vaginal bleeding, unspecified: N93.9

## 2024-06-23 HISTORY — DX: Iron deficiency anemia secondary to blood loss (chronic): D50.0

## 2024-06-23 HISTORY — DX: Cardiac murmur, unspecified: R01.1

## 2024-06-23 HISTORY — PX: ROBOTIC ASSISTED TOTAL HYSTERECTOMY WITH BILATERAL SALPINGO OOPHERECTOMY: SHX6086

## 2024-06-23 HISTORY — DX: Presence of spectacles and contact lenses: Z97.3

## 2024-06-23 HISTORY — DX: Leiomyoma of uterus, unspecified: D25.9

## 2024-06-23 HISTORY — DX: Personal history of urinary calculi: Z87.442

## 2024-06-23 HISTORY — DX: Unspecified ovarian cyst, left side: N83.202

## 2024-06-23 LAB — ABO/RH: ABO/RH(D): B POS

## 2024-06-23 LAB — POCT PREGNANCY, URINE: Preg Test, Ur: NEGATIVE

## 2024-06-23 SURGERY — HYSTERECTOMY, TOTAL, ROBOT-ASSISTED, LAPAROSCOPIC, WITH BILATERAL SALPINGO-OOPHORECTOMY
Anesthesia: General | Site: Pelvis | Laterality: Bilateral

## 2024-06-23 MED ORDER — CELECOXIB 200 MG PO CAPS
200.0000 mg | ORAL_CAPSULE | Freq: Once | ORAL | Status: AC
  Administered 2024-06-23: 200 mg via ORAL

## 2024-06-23 MED ORDER — SODIUM CHLORIDE 0.9 % IV SOLN
2.0000 g | INTRAVENOUS | Status: AC
  Administered 2024-06-23: 2 g via INTRAVENOUS
  Filled 2024-06-23: qty 2

## 2024-06-23 MED ORDER — SODIUM CHLORIDE 0.9 % IV SOLN
INTRAVENOUS | Status: DC | PRN
  Administered 2024-06-23: 10 mL
  Administered 2024-06-23: 50 mL

## 2024-06-23 MED ORDER — SODIUM CHLORIDE 0.9 % IR SOLN
Status: DC | PRN
  Administered 2024-06-23: 1000 mL

## 2024-06-23 MED ORDER — PROPOFOL 10 MG/ML IV BOLUS
INTRAVENOUS | Status: AC
  Filled 2024-06-23: qty 20

## 2024-06-23 MED ORDER — IBUPROFEN 100 MG/5ML PO SUSP: 800.0000 mg | Freq: Three times a day (TID) | ORAL | Status: DC

## 2024-06-23 MED ORDER — MEPERIDINE HCL 25 MG/ML IJ SOLN: 6.2500 mg | INTRAMUSCULAR | Status: DC | PRN

## 2024-06-23 MED ORDER — ONDANSETRON HCL 4 MG/2ML IJ SOLN
4.0000 mg | Freq: Four times a day (QID) | INTRAMUSCULAR | Status: DC | PRN
  Administered 2024-06-23: 4 mg via INTRAVENOUS
  Filled 2024-06-23: qty 2

## 2024-06-23 MED ORDER — OXYCODONE HCL 5 MG/5ML PO SOLN: 5.0000 mg | ORAL | Status: DC | PRN

## 2024-06-23 MED ORDER — LIDOCAINE 2% (20 MG/ML) 5 ML SYRINGE
INTRAMUSCULAR | Status: DC | PRN
  Administered 2024-06-23: 60 mg via INTRAVENOUS

## 2024-06-23 MED ORDER — SUGAMMADEX SODIUM 200 MG/2ML IV SOLN
INTRAVENOUS | Status: AC
  Filled 2024-06-23: qty 2

## 2024-06-23 MED ORDER — PROPOFOL 10 MG/ML IV BOLUS
INTRAVENOUS | Status: DC | PRN
  Administered 2024-06-23: 50 mg via INTRAVENOUS
  Administered 2024-06-23: 130 mg via INTRAVENOUS

## 2024-06-23 MED ORDER — ACETAMINOPHEN 160 MG/5ML PO SOLN
650.0000 mg | Freq: Four times a day (QID) | ORAL | Status: DC
  Administered 2024-06-23: 650 mg via ORAL
  Filled 2024-06-23: qty 20.3

## 2024-06-23 MED ORDER — HYDROMORPHONE HCL 1 MG/ML IJ SOLN: 0.2000 mg | INTRAMUSCULAR | Status: DC | PRN

## 2024-06-23 MED ORDER — IBUPROFEN 100 MG/5ML PO SUSP
800.0000 mg | Freq: Three times a day (TID) | ORAL | 0 refills | Status: AC | PRN
  Filled 2024-06-23: qty 240, 2d supply, fill #0

## 2024-06-23 MED ORDER — FENTANYL CITRATE (PF) 250 MCG/5ML IJ SOLN
INTRAMUSCULAR | Status: AC
  Filled 2024-06-23: qty 5

## 2024-06-23 MED ORDER — ACETAMINOPHEN 500 MG PO TABS
1000.0000 mg | ORAL_TABLET | ORAL | Status: AC
  Administered 2024-06-23: 1000 mg via ORAL

## 2024-06-23 MED ORDER — ONDANSETRON HCL 4 MG/2ML IJ SOLN
INTRAMUSCULAR | Status: AC
  Filled 2024-06-23: qty 2

## 2024-06-23 MED ORDER — CHLORHEXIDINE GLUCONATE 0.12 % MT SOLN
OROMUCOSAL | Status: AC
  Filled 2024-06-23: qty 15

## 2024-06-23 MED ORDER — LACTATED RINGERS IV SOLN: INTRAVENOUS | Status: DC

## 2024-06-23 MED ORDER — FENTANYL CITRATE (PF) 100 MCG/2ML IJ SOLN
INTRAMUSCULAR | Status: AC
Start: 2024-06-23 — End: 2024-06-23
  Filled 2024-06-23: qty 2

## 2024-06-23 MED ORDER — ROCURONIUM BROMIDE 10 MG/ML (PF) SYRINGE
PREFILLED_SYRINGE | INTRAVENOUS | Status: DC | PRN
  Administered 2024-06-23: 20 mg via INTRAVENOUS
  Administered 2024-06-23: 30 mg via INTRAVENOUS
  Administered 2024-06-23: 50 mg via INTRAVENOUS

## 2024-06-23 MED ORDER — ONDANSETRON HCL 4 MG/2ML IJ SOLN
INTRAMUSCULAR | Status: DC | PRN
  Administered 2024-06-23: 4 mg via INTRAVENOUS

## 2024-06-23 MED ORDER — ONDANSETRON HCL 4 MG PO TABS: 4.0000 mg | ORAL_TABLET | Freq: Four times a day (QID) | ORAL | Status: DC | PRN

## 2024-06-23 MED ORDER — MIDAZOLAM HCL 2 MG/2ML IJ SOLN
INTRAMUSCULAR | Status: DC | PRN
  Administered 2024-06-23: 2 mg via INTRAVENOUS

## 2024-06-23 MED ORDER — ACETAMINOPHEN 500 MG PO TABS
ORAL_TABLET | ORAL | Status: AC
  Filled 2024-06-23: qty 2

## 2024-06-23 MED ORDER — ORAL CARE MOUTH RINSE
15.0000 mL | Freq: Once | OROMUCOSAL | Status: AC
Start: 2024-06-23 — End: 2024-06-23

## 2024-06-23 MED ORDER — HEMOSTATIC AGENTS (NO CHARGE) OPTIME
TOPICAL | Status: DC | PRN
  Administered 2024-06-23: 1

## 2024-06-23 MED ORDER — ROCURONIUM BROMIDE 10 MG/ML (PF) SYRINGE
PREFILLED_SYRINGE | INTRAVENOUS | Status: AC
  Filled 2024-06-23: qty 10

## 2024-06-23 MED ORDER — MIDAZOLAM HCL 2 MG/2ML IJ SOLN
INTRAMUSCULAR | Status: AC
  Filled 2024-06-23: qty 2

## 2024-06-23 MED ORDER — SIMETHICONE 80 MG PO CHEW: 80.0000 mg | CHEWABLE_TABLET | Freq: Four times a day (QID) | ORAL | Status: DC | PRN

## 2024-06-23 MED ORDER — BUPIVACAINE HCL (PF) 0.25 % IJ SOLN
INTRAMUSCULAR | Status: AC
  Filled 2024-06-23: qty 30

## 2024-06-23 MED ORDER — ACETAMINOPHEN 160 MG/5ML PO SUSP
650.0000 mg | Freq: Four times a day (QID) | ORAL | 0 refills | Status: AC | PRN
  Filled 2024-06-23: qty 118, 2d supply, fill #0

## 2024-06-23 MED ORDER — ALUM & MAG HYDROXIDE-SIMETH 200-200-20 MG/5ML PO SUSP
30.0000 mL | ORAL | Status: DC | PRN
Start: 2024-06-23 — End: 2024-06-23

## 2024-06-23 MED ORDER — SODIUM CHLORIDE (PF) 0.9 % IJ SOLN
INTRAMUSCULAR | Status: AC
Start: 2024-06-23 — End: 2024-06-23
  Filled 2024-06-23: qty 50

## 2024-06-23 MED ORDER — OXYCODONE HCL 5 MG/5ML PO SOLN: 5.0000 mg | Freq: Once | ORAL | Status: DC | PRN

## 2024-06-23 MED ORDER — CELECOXIB 200 MG PO CAPS
ORAL_CAPSULE | ORAL | Status: DC
Start: 2024-06-23 — End: 2024-06-23
  Filled 2024-06-23: qty 1

## 2024-06-23 MED ORDER — METHYLENE BLUE (ANTIDOTE) 1 % IV SOLN
INTRAVENOUS | Status: AC
  Filled 2024-06-23: qty 10

## 2024-06-23 MED ORDER — DEXAMETHASONE SODIUM PHOSPHATE 10 MG/ML IJ SOLN
INTRAMUSCULAR | Status: DC | PRN
  Administered 2024-06-23: 10 mg via INTRAVENOUS

## 2024-06-23 MED ORDER — GABAPENTIN 300 MG PO CAPS
ORAL_CAPSULE | ORAL | Status: AC
  Filled 2024-06-23: qty 1

## 2024-06-23 MED ORDER — BUPIVACAINE LIPOSOME 1.3 % IJ SUSP
INTRAMUSCULAR | Status: AC
  Filled 2024-06-23: qty 20

## 2024-06-23 MED ORDER — FENTANYL CITRATE (PF) 100 MCG/2ML IJ SOLN
25.0000 ug | INTRAMUSCULAR | Status: DC | PRN
  Administered 2024-06-23: 25 ug via INTRAVENOUS

## 2024-06-23 MED ORDER — MENTHOL 3 MG MT LOZG: 1.0000 | LOZENGE | OROMUCOSAL | Status: DC | PRN

## 2024-06-23 MED ORDER — PHENYLEPHRINE 80 MCG/ML (10ML) SYRINGE FOR IV PUSH (FOR BLOOD PRESSURE SUPPORT)
PREFILLED_SYRINGE | INTRAVENOUS | Status: AC
  Filled 2024-06-23: qty 10

## 2024-06-23 MED ORDER — GABAPENTIN 300 MG PO CAPS
300.0000 mg | ORAL_CAPSULE | ORAL | Status: AC
  Administered 2024-06-23: 300 mg via ORAL

## 2024-06-23 MED ORDER — OXYCODONE HCL 5 MG PO TABS: 5.0000 mg | ORAL_TABLET | Freq: Once | ORAL | Status: DC | PRN

## 2024-06-23 MED ORDER — PHENYLEPHRINE HCL-NACL 20-0.9 MG/250ML-% IV SOLN
INTRAVENOUS | Status: AC
  Filled 2024-06-23: qty 500

## 2024-06-23 MED ORDER — FENTANYL CITRATE (PF) 250 MCG/5ML IJ SOLN
INTRAMUSCULAR | Status: DC | PRN
  Administered 2024-06-23: 100 ug via INTRAVENOUS
  Administered 2024-06-23: 50 ug via INTRAVENOUS

## 2024-06-23 MED ORDER — LIDOCAINE 2% (20 MG/ML) 5 ML SYRINGE
INTRAMUSCULAR | Status: AC
  Filled 2024-06-23: qty 5

## 2024-06-23 MED ORDER — OXYCODONE HCL 5 MG/5ML PO SOLN
5.0000 mg | ORAL | 0 refills | Status: AC | PRN
  Filled 2024-06-23: qty 100, 4d supply, fill #0

## 2024-06-23 MED ORDER — POVIDONE-IODINE 10 % EX SWAB
2.0000 | Freq: Once | CUTANEOUS | Status: AC
Start: 2024-06-23 — End: 2024-06-23
  Administered 2024-06-23: 2 via TOPICAL

## 2024-06-23 MED ORDER — DEXAMETHASONE SODIUM PHOSPHATE 10 MG/ML IJ SOLN
INTRAMUSCULAR | Status: AC
  Filled 2024-06-23: qty 1

## 2024-06-23 MED ORDER — ROPIVACAINE HCL 5 MG/ML IJ SOLN
INTRAMUSCULAR | Status: AC
  Filled 2024-06-23: qty 30

## 2024-06-23 MED ORDER — BUPIVACAINE LIPOSOME 1.3 % IJ SUSP
INTRAMUSCULAR | Status: DC | PRN
  Administered 2024-06-23: 50 mL

## 2024-06-23 MED ORDER — ONDANSETRON HCL 4 MG/2ML IJ SOLN: 4.0000 mg | Freq: Once | INTRAMUSCULAR | Status: DC | PRN

## 2024-06-23 MED ORDER — CHLORHEXIDINE GLUCONATE 0.12 % MT SOLN
15.0000 mL | Freq: Once | OROMUCOSAL | Status: AC
  Administered 2024-06-23: 15 mL via OROMUCOSAL

## 2024-06-23 MED ORDER — SUGAMMADEX SODIUM 200 MG/2ML IV SOLN
INTRAVENOUS | Status: DC | PRN
  Administered 2024-06-23: 200 mg via INTRAVENOUS
  Administered 2024-06-23: 100 mg via INTRAVENOUS

## 2024-06-23 MED ORDER — PANTOPRAZOLE SODIUM 40 MG IV SOLR: 40.0000 mg | Freq: Every day | INTRAVENOUS | Status: DC

## 2024-06-23 SURGICAL SUPPLY — 58 items
APPLICATOR ARISTA FLEXITIP XL (MISCELLANEOUS) IMPLANT
BARRIER ADHS 3X4 INTERCEED (GAUZE/BANDAGES/DRESSINGS) IMPLANT
CANNULA CAP OBTURATR AIRSEAL 8 (CAP) ×2 IMPLANT
CAUTERY HOOK MNPLR 1.6 DVNC XI (INSTRUMENTS) IMPLANT
CELLS DAT CNTRL 66122 CELL SVR (MISCELLANEOUS) IMPLANT
COVER BACK TABLE 60X90IN (DRAPES) ×2 IMPLANT
COVER TIP SHEARS 8 DVNC (MISCELLANEOUS) ×2 IMPLANT
DEFOGGER SCOPE WARM SEASHARP (MISCELLANEOUS) ×2 IMPLANT
DERMABOND ADVANCED .7 DNX12 (GAUZE/BANDAGES/DRESSINGS) ×2 IMPLANT
DILATOR CANAL MILEX (MISCELLANEOUS) ×2 IMPLANT
DRAPE ARM DVNC X/XI (DISPOSABLE) ×8 IMPLANT
DRAPE COLUMN DVNC XI (DISPOSABLE) ×2 IMPLANT
DRAPE SURG IRRIG POUCH 19X23 (DRAPES) ×2 IMPLANT
DRAPE UTILITY 15X26 TOWEL STRL (DRAPES) ×2 IMPLANT
DRIVER NDL MEGA 8 DVNC XI (INSTRUMENTS) ×2 IMPLANT
DRIVER NDLE MEGA DVNC XI (INSTRUMENTS) ×1 IMPLANT
DURAPREP 26ML APPLICATOR (WOUND CARE) ×2 IMPLANT
ELECTRODE REM PT RTRN 9FT ADLT (ELECTROSURGICAL) ×2 IMPLANT
FORCEPS BPLR LNG DVNC XI (INSTRUMENTS) ×2 IMPLANT
FORCEPS PROGRASP DVNC XI (FORCEP) IMPLANT
FORCEPS TENACULUM DVNC XI (FORCEP) IMPLANT
GAUZE 4X4 16PLY ~~LOC~~+RFID DBL (SPONGE) IMPLANT
GLOVE BIOGEL M 6.5 STRL (GLOVE) ×6 IMPLANT
GLOVE BIOGEL PI IND STRL 6.5 (GLOVE) ×6 IMPLANT
GRASPER COBRA DVNC RU (INSTRUMENTS) ×2 IMPLANT
HEMOSTAT ARISTA ABSORB 3G PWDR (HEMOSTASIS) IMPLANT
HIBICLENS CHG 4% 4OZ BTL (MISCELLANEOUS) ×4 IMPLANT
IRRIGATION STRYKERFLOW (MISCELLANEOUS) ×2 IMPLANT
KIT PINK PAD W/HEAD ARM REST (MISCELLANEOUS) ×2 IMPLANT
LEGGING LITHOTOMY PAIR STRL (DRAPES) ×2 IMPLANT
MANIPULATOR ADVINCU DEL 2.5 PL (MISCELLANEOUS) IMPLANT
MANIPULATOR ADVINCU DEL 3.0 PL (MISCELLANEOUS) IMPLANT
MANIPULATOR ADVINCU DEL 3.5 PL (MISCELLANEOUS) IMPLANT
MANIPULATOR ADVINCU DEL 4.0 PL (MISCELLANEOUS) IMPLANT
OBTURATOR OPTICALSTD 8 DVNC (TROCAR) ×2 IMPLANT
OCCLUDER COLPOPNEUMO (BALLOONS) IMPLANT
PACK ROBOT WH (CUSTOM PROCEDURE TRAY) ×2 IMPLANT
PACK ROBOTIC GOWN (GOWN DISPOSABLE) ×2 IMPLANT
PAD OB MATERNITY 11 LF (PERSONAL CARE ITEMS) ×2 IMPLANT
POWDER SURGICEL 3.0 GRAM (HEMOSTASIS) IMPLANT
RETRACTOR WND ALEXIS 18 MED (MISCELLANEOUS) IMPLANT
SCISSORS LAP 5X45 EPIX DISP (ENDOMECHANICALS) ×2 IMPLANT
SCISSORS MNPLR CVD DVNC XI (INSTRUMENTS) ×2 IMPLANT
SEAL UNIV 5-12 XI (MISCELLANEOUS) ×4 IMPLANT
SEALER VESSEL EXT DVNC XI (MISCELLANEOUS) IMPLANT
SET IRRIG Y TYPE TUR BLADDER L (SET/KITS/TRAYS/PACK) IMPLANT
SET TRI-LUMEN FLTR TB AIRSEAL (TUBING) IMPLANT
SET TUBE FILTERED XL AIRSEAL (SET/KITS/TRAYS/PACK) ×2 IMPLANT
SPIKE FLUID TRANSFER (MISCELLANEOUS) ×4 IMPLANT
SUT VIC AB 0 CT1 27XBRD ANBCTR (SUTURE) ×4 IMPLANT
SUT VICRYL 0 UR6 27IN ABS (SUTURE) IMPLANT
SUT VICRYL RAPIDE 4/0 PS 2 (SUTURE) ×4 IMPLANT
SUT VLOC 180 0 9IN GS21 (SUTURE) ×2 IMPLANT
TIP ENDOSCOPIC SURGICEL (TIP) IMPLANT
TOWEL GREEN STERILE (TOWEL DISPOSABLE) ×2 IMPLANT
TROCAR PORT AIRSEAL 8X120 (TROCAR) IMPLANT
UNDERPAD 30X36 HEAVY ABSORB (UNDERPADS AND DIAPERS) ×2 IMPLANT
WATER STERILE IRR 1000ML POUR (IV SOLUTION) ×2 IMPLANT

## 2024-06-23 NOTE — Anesthesia Postprocedure Evaluation (Signed)
 Anesthesia Post Note  Patient: SOMALIA SEGLER  Procedure(s) Performed: HYSTERECTOMY, TOTAL, ROBOT-ASSISTED, LAPAROSCOPIC, WITH BILATERAL SALPINGO-OOPHORECTOMY (Bilateral: Pelvis)     Patient location during evaluation: PACU Anesthesia Type: General Level of consciousness: awake and alert Pain management: pain level controlled Vital Signs Assessment: post-procedure vital signs reviewed and stable Respiratory status: spontaneous breathing, nonlabored ventilation, respiratory function stable and patient connected to nasal cannula oxygen Cardiovascular status: blood pressure returned to baseline and stable Postop Assessment: no apparent nausea or vomiting Anesthetic complications: no   No notable events documented.  Last Vitals:  Vitals:   06/23/24 1312 06/23/24 1519  BP: (!) 146/73 (!) 155/80  Pulse: 83 82  Resp: 18 18  Temp: (!) 36.4 C 36.7 C  SpO2: 99% 98%    Last Pain:  Vitals:   06/23/24 1519  TempSrc: Oral  PainSc:    Pain Goal: Patients Stated Pain Goal: 5 (06/23/24 0612)                 Caydon Feasel

## 2024-06-23 NOTE — Plan of Care (Signed)

## 2024-06-23 NOTE — Discharge Summary (Signed)
 Physician Discharge Summary  Patient ID: Julia Cobb MRN: [id number] DOB/AGE: [birth year] 48 y.o.  Admit date: 06/23/2024 Discharge date: 06/23/2024  Admission Diagnoses: Fibroids Chronic blood loss anemia Abnormal uterine bleeding Left ovarian cyst  Discharge Diagnoses: Same  Principal Problem:   Abnormal uterine bleeding (AUB)   Discharged Condition: good  Hospital Course: pt was admitted for observation after undergoing robotic assisted laparoscopic hysterectomy with bilateral salpingo-oophorectomy. She was discharged home on pod #0 in stable condition. At the time of discharge her pain was well controlled. She was tolerating po. She voided and was ambulating without difficulty.   Consults: None  Significant Diagnostic Studies: None  Treatments: surgery: robotic assisted laparoscopic hysterectomy with bilateral salpingo-oophorectomy   Discharge Exam: Blood pressure (!) 146/73, pulse 83, temperature (!) 97.5 F (36.4 C), temperature source Axillary, resp. rate 18, height 5' 2 (1.575 m), weight 72.6 kg, last menstrual period 04/13/2024, SpO2 99%. General appearance: alert, cooperative, and no distress Resp: no distress  GI: soft appropriately tender nondistended no rebound no guarding  Extremities: extremities normal, atraumatic, no cyanosis or edema Incision/Wound: incisions were well approximated with erythema or exudate   Disposition:   Discharge Instructions     Call MD for:  persistant nausea and vomiting   Complete by: As directed    Call MD for:  redness, tenderness, or signs of infection (pain, swelling, redness, odor or green/yellow discharge around incision site)   Complete by: As directed    Call MD for:  severe uncontrolled pain   Complete by: As directed    Call MD for:  temperature >100.4   Complete by: As directed    Diet general   Complete by: As directed    Driving Restrictions   Complete by: As directed    Avoid driving for 1 week  and  while  requiring narcotics for pain   Increase activity slowly   Complete by: As directed    Lifting restrictions   Complete by: As directed    Avoid lifting over 10 lbs for at least 6 weeks and until approved by Dr. Rosalva   May shower / Bathe   Complete by: As directed    May walk up steps   Complete by: As directed    No wound care   Complete by: As directed    Sexual Activity Restrictions   Complete by: As directed    Avoid sex for 6 weeks and until approved by Dr. Rosalva      Allergies as of 06/23/2024       Reactions   Medroxyprogesterone Shortness Of Breath, Palpitations        Medication List     STOP taking these medications    FERROUS FUMARATE-VITAMIN C PO   Gallifrey 5 MG tablet Generic drug: norethindrone   ibuprofen  200 MG tablet Commonly known as: ADVIL  Replaced by: ibuprofen  100 MG/5ML suspension       TAKE these medications    acetaminophen  160 MG/5ML solution Commonly known as: TYLENOL  Take 20.3 mLs (650 mg total) by mouth every 6 (six) hours as needed for moderate pain (pain score 4-6).   cyanocobalamin  1000 MCG tablet Commonly known as: VITAMIN B12 Take 1,000 mcg by mouth daily.   fluticasone 50 MCG/ACT nasal spray Commonly known as: FLONASE Place 1 spray into both nostrils daily as needed for allergies or rhinitis.   HAIR SKIN & NAILS GUMMIES PO Take 2 each by mouth daily.   ibuprofen  100 MG/5ML suspension Commonly known as: ADVIL   Take 40 mLs (800 mg total) by mouth every 8 (eight) hours as needed for up to 30 doses for mild pain (pain score 1-3). Replaces: ibuprofen  200 MG tablet   multivitamin tablet Take 1 tablet by mouth daily.   oxyCODONE  5 MG/5ML solution Commonly known as: ROXICODONE  Take 5 mLs (5 mg total) by mouth every 4 (four) hours as needed for up to 20 doses for severe pain (pain score 7-10).   Vitamin D (Ergocalciferol) 50 MCG (2000 UT) Caps Take 1 capsule by mouth daily.        Follow-up Information     Rosalva Sawyer, MD. Go in 2 week(s).   Specialty: Obstetrics and Gynecology Contact information: [address] [place] [address] [phone]                 Signed: Sawyer Rosalva 06/23/2024, 2:16 PM

## 2024-06-23 NOTE — Progress Notes (Signed)
   06/23/24 1645  Departure Condition  Departure Condition Good  Mobility at [employer]  Patient/Caregiver Teaching Discharge instructions reviewed;Prescriptions reviewed;Follow-up care reviewed;Teach Back Method Used;Admission discussed;Pain management discussed;Medications discussed;Patient/caregiver verbalized understanding  Departure Mode With significant other  Was procedural sedation performed on this patient during this visit? Yes   Pt alert and orient x4, vs and pain stable at discharge

## 2024-06-23 NOTE — Transfer of Care (Signed)
 Immediate Anesthesia Transfer of Care Note  Patient: Julia Cobb  Procedure(s) Performed: HYSTERECTOMY, TOTAL, ROBOT-ASSISTED, LAPAROSCOPIC, WITH BILATERAL SALPINGO-OOPHORECTOMY (Bilateral: Pelvis)  Patient Location: PACU  Anesthesia Type:General  Level of Consciousness: awake, alert , patient cooperative, and responds to stimulation  Airway & Oxygen Therapy: Patient Spontanous Breathing and Patient connected to nasal cannula oxygen  Post-op Assessment: Report given to RN, Post -op Vital signs reviewed and stable, and Patient moving all extremities X 4  Post vital signs: Reviewed and stable  Last Vitals:  Vitals Value Taken Time  BP    Temp    Pulse 84 06/23/24 10:22  Resp 15 06/23/24 10:22  SpO2 100 % 06/23/24 10:22  Vitals shown include unfiled device data.  Last Pain:  Vitals:   06/23/24 0612  TempSrc: Oral  PainSc: 0-No pain      Patients Stated Pain Goal: 5 (06/23/24 0612)  Complications: No notable events documented.

## 2024-06-23 NOTE — Op Note (Signed)
 06/23/2024  10:13 AM  PATIENT:  Julia Cobb  48 y.o. female  PRE-OPERATIVE DIAGNOSIS:  Fibroids Chronic blood loss anemia Abnormal uterine bleeding Left ovarian cyst  POST-OPERATIVE DIAGNOSIS:  FibroidsChronic blood loss anemiaAbnormal uterine bleedingLeft ovarian cyst  PROCEDURE:  Procedure(s): HYSTERECTOMY, TOTAL, ROBOT-ASSISTED, LAPAROSCOPIC, WITH BILATERAL SALPINGO-OOPHORECTOMY (Bilateral)  SURGEON:  Surgeons and Role:    DEWAINE Rosalva Sawyer, MD - Primary  PHYSICIAN ASSISTANT:   ASSISTANTS: Tracie Sumner RNFA assisted An experienced assistant was required given the standard of surgical care given the complexity of the case.  This assistant was needed for exposure, dissection, suctioning, retraction, instrument exchange, assisting with delivery with administration of fundal pressure, and for overall help during the procedure.    ANESTHESIA:   general  EBL:  50 mL   BLOOD ADMINISTERED:none  DRAINS: Urinary Catheter (Foley)   LOCAL MEDICATIONS USED:  OTHER Ropivicaine/ Exparel  with Marcaine    SPECIMEN:  Source of Specimen:  Uterus cervix bilateral fallopian tubes and ovaries  Specimen weight was 678 grams   DISPOSITION OF SPECIMEN:  PATHOLOGY  COUNTS:  YES  TOURNIQUET:  * No tourniquets in log *  DICTATION: .Note written in EPIC  PLAN OF CARE: Admit for overnight observation  PATIENT DISPOSITION:  PACU - hemodynamically stable.   Delay start of Pharmacological VTE agent (>24hrs) due to surgical blood loss or risk of bleeding: not applicable  Findings: normal external genitalia/ vaginal mucosa and cervix. Enlarged Fibroid uterus. The fallopian tubes and ovaries appeared normal bilaterally.   Specimen Weight was 678 grams.   Procedure: The patient was taken to the operating room  #6 [facility] where she was placed under general anesthesia. Time out was performed. She was placed in dorsal lithotomy position and prepped and draped in the usual sterile fashion.  A weighted speculum was placed into the vagina. A Deaver was placed anteriorly for retraction. The anterior lip of the cervix was grasped with a single-tooth tenaculum. The vaginal mucosa was injected with 2.5 cc of ropivacaine  at the 2/4/ 8 and 10 o'clock positions. The uterus was sounded to 13 cm. the cervix was dilated to 6 mm . 0 vicryl suture placed at the 12 and 6:00 positions Of the cervix to facilitate placement of a uterine manipulator. The manipulator was placed without difficulty. Weighted speculum and Deaver were removed .  Attention was turned to the patient's abdomen where a 8 mm trocar was placed 2 cm above the umbilicus. under direct visualization . The pneumoperitoneum was achieved with PCO2 gas. The laparoscope was removed. 60 cc of ropivacaine  were injected into the abdominal cavity. The laparoscope was reinserted. An 8 mm trocar was placed in the right upper quadrant 16 centimeters from the umbilicus.later connected to robotic arm #4). An incision was made in the Right upper quadrant TROCAR WAS PLACED 8 cm from the umbilicus. Later connected to robotic arm #3. An 8 mm incision was made in the left upper quadrant 16 cm from the umbilicus and connected to robot arm #1. ( All incisions  were injected with 10cc of  exparel  mixed with marcaine  prior to port placement. )  Once all ports had been placed under direct visualization.The laparoscope was removed and the da Vinci robotic system was thin right-sided docked. The robotic arms were connected to the corresponding trocars as listed above. The laparoscope was then reinserted. The long tip bipolar forceps were placed into port #1. The  prograsp was  placed in the port #4. A vessel sealer was  placed in port #3. All instruments were directed into the pelvis under direct visualization.    Attention was turned to the surgeons console.. The left infundibulo-pelvic ligament was cauterized and transected with the vessel sealer The broad ligament  was cauterized and transected with the vessel sealer .The round ligament was cauterized and transected with the vessel sealer  The anterior leaf of broad ligament was incised along the bladder reflection to the midline.  The right  infundibulo-pelvic  ligament was cauterized and transected with the vessel sealer. The right broad ligament was cauterized and transected with the vessel sealer. The right round ligament was cauterized and transected with the vessel sealer The broad ligament was incised to the midline. The bladder was dissected off the lower uterine segments of the cervix via sharp and blunt dissection.   The uterine arteries were skeleton bilaterally. They were  cauterized and transected with the vessel sealer The KOH ring was identified. The anterior colpotomy was performed followed by the posterior colpotomy. Once the uterus,cervix and bilateral fallopian tubes were completely excised was removed through the vagina. Due to the size of the specimen it was bivalved to facilitate removal from the vagina.  The  bipolar forceps and scissors were removed and cobra  forceps were placed in the port #1 and the mega  needle driver was placed in to port #3.  The vaginal cuff was closed with running suture if 0 v-lock. The pelvis was irrigated. SABRASABRASABRAExcellent hemostasis was noted.  Surgicel powder  was placed along the vaginal cuff.  All pelvic pedicles were examined and hemostasis was noted.  All instruments removed from the ports. All ports were removed under direct Visualization. The pneumoperitoneum was released. The skin incisions were closed with 4-0 Vicryl and then covered with Derma bond.     Sponge lap and needle counts weIre correct x. The patient was awakened from anesthesia and taken to the recovery room in stable condition.

## 2024-06-23 NOTE — Anesthesia Procedure Notes (Signed)
 Procedure Name: Intubation Date/Time: 06/23/2024 7:49 AM  Performed by: Jolynn Mage, CRNAPre-anesthesia Checklist: Patient identified, Patient being monitored, Timeout performed, Emergency Drugs available and Suction available Patient Re-evaluated:Patient Re-evaluated prior to induction Oxygen Delivery Method: Circle system utilized Preoxygenation: Pre-oxygenation with 100% oxygen Induction Type: IV induction Ventilation: Mask ventilation without difficulty Laryngoscope Size: Mac and 3 Grade View: Grade I Tube type: Oral Tube size: 7.0 mm Number of attempts: 1 Airway Equipment and Method: Stylet Placement Confirmation: ETT inserted through vocal cords under direct vision, positive ETCO2 and breath sounds checked- equal and bilateral Secured at: 21 cm Tube secured with: Tape Dental Injury: Teeth and Oropharynx as per pre-operative assessment

## 2024-06-23 NOTE — H&P (Signed)
 Date of Initial H&P: 06/22/2024  History reviewed, patient examined, no change in status, stable for surgery.

## 2024-06-24 ENCOUNTER — Encounter (HOSPITAL_COMMUNITY): Payer: Self-pay | Admitting: Obstetrics and Gynecology

## 2024-06-25 LAB — SURGICAL PATHOLOGY

## 2024-09-09 ENCOUNTER — Telehealth: Payer: Self-pay

## 2024-09-09 NOTE — Telephone Encounter (Signed)
 Canceled appointments per the patients request via left VM from the patient. Called and talked with the patient and she is aware of the canceled appointments. Patient states she does not wish to reschedule at this time. Patient was informed to call back to reschedule at anytime.

## 2024-10-05 ENCOUNTER — Other Ambulatory Visit

## 2024-10-08 ENCOUNTER — Ambulatory Visit
